# Patient Record
Sex: Female | Born: 1980 | Race: White | Hispanic: No | Marital: Single | Smoking: Never smoker
Health system: Southern US, Community
[De-identification: ages and names within clinical notes are randomized; demographics above are authoritative.]

## PROBLEM LIST (undated history)

## (undated) DIAGNOSIS — I2510 Atherosclerotic heart disease of native coronary artery without angina pectoris: Secondary | ICD-10-CM

## (undated) DIAGNOSIS — I214 Non-ST elevation (NSTEMI) myocardial infarction: Secondary | ICD-10-CM

## (undated) DIAGNOSIS — I10 Essential (primary) hypertension: Secondary | ICD-10-CM

---

## 2001-09-02 ENCOUNTER — Encounter: Payer: Self-pay | Admitting: Emergency Medicine

## 2001-09-02 ENCOUNTER — Emergency Department (HOSPITAL_COMMUNITY): Admission: EM | Admit: 2001-09-02 | Discharge: 2001-09-02 | Payer: Self-pay | Admitting: Emergency Medicine

## 2002-10-21 ENCOUNTER — Other Ambulatory Visit: Admission: RE | Admit: 2002-10-21 | Discharge: 2002-10-21 | Payer: Self-pay | Admitting: Obstetrics and Gynecology

## 2002-10-22 ENCOUNTER — Other Ambulatory Visit: Admission: RE | Admit: 2002-10-22 | Discharge: 2002-10-22 | Payer: Self-pay | Admitting: Obstetrics and Gynecology

## 2003-12-05 ENCOUNTER — Other Ambulatory Visit: Admission: RE | Admit: 2003-12-05 | Discharge: 2003-12-05 | Payer: Self-pay | Admitting: Obstetrics and Gynecology

## 2004-03-11 ENCOUNTER — Emergency Department (HOSPITAL_COMMUNITY): Admission: EM | Admit: 2004-03-11 | Discharge: 2004-03-11 | Payer: Self-pay | Admitting: Emergency Medicine

## 2004-10-17 ENCOUNTER — Emergency Department (HOSPITAL_COMMUNITY): Admission: EM | Admit: 2004-10-17 | Discharge: 2004-10-17 | Payer: Self-pay | Admitting: Emergency Medicine

## 2004-10-22 ENCOUNTER — Other Ambulatory Visit: Admission: RE | Admit: 2004-10-22 | Discharge: 2004-10-22 | Payer: Self-pay | Admitting: Obstetrics and Gynecology

## 2005-04-25 ENCOUNTER — Emergency Department (HOSPITAL_COMMUNITY): Admission: EM | Admit: 2005-04-25 | Discharge: 2005-04-25 | Payer: Self-pay | Admitting: Emergency Medicine

## 2005-05-14 ENCOUNTER — Emergency Department (HOSPITAL_COMMUNITY): Admission: EM | Admit: 2005-05-14 | Discharge: 2005-05-14 | Payer: Self-pay | Admitting: Emergency Medicine

## 2007-06-10 ENCOUNTER — Emergency Department (HOSPITAL_COMMUNITY): Admission: EM | Admit: 2007-06-10 | Discharge: 2007-06-10 | Payer: Self-pay | Admitting: Emergency Medicine

## 2007-09-14 ENCOUNTER — Emergency Department (HOSPITAL_BASED_OUTPATIENT_CLINIC_OR_DEPARTMENT_OTHER): Admission: EM | Admit: 2007-09-14 | Discharge: 2007-09-14 | Payer: Self-pay | Admitting: Emergency Medicine

## 2007-09-19 ENCOUNTER — Emergency Department (HOSPITAL_COMMUNITY): Admission: EM | Admit: 2007-09-19 | Discharge: 2007-09-19 | Payer: Self-pay | Admitting: Emergency Medicine

## 2007-11-07 ENCOUNTER — Other Ambulatory Visit: Payer: Self-pay | Admitting: Emergency Medicine

## 2007-11-08 ENCOUNTER — Inpatient Hospital Stay (HOSPITAL_COMMUNITY): Admission: AD | Admit: 2007-11-08 | Discharge: 2007-11-08 | Payer: Self-pay | Admitting: Obstetrics and Gynecology

## 2008-02-27 ENCOUNTER — Emergency Department (HOSPITAL_BASED_OUTPATIENT_CLINIC_OR_DEPARTMENT_OTHER): Admission: EM | Admit: 2008-02-27 | Discharge: 2008-02-27 | Payer: Self-pay | Admitting: Emergency Medicine

## 2008-04-12 ENCOUNTER — Ambulatory Visit: Payer: Self-pay | Admitting: Internal Medicine

## 2008-04-12 ENCOUNTER — Ambulatory Visit: Payer: Self-pay | Admitting: Interventional Radiology

## 2008-04-12 ENCOUNTER — Encounter: Payer: Self-pay | Admitting: Emergency Medicine

## 2008-04-13 ENCOUNTER — Ambulatory Visit: Payer: Self-pay | Admitting: Cardiovascular Disease

## 2008-04-13 ENCOUNTER — Observation Stay (HOSPITAL_COMMUNITY): Admission: EM | Admit: 2008-04-13 | Discharge: 2008-04-13 | Payer: Self-pay | Admitting: Cardiology

## 2008-04-13 ENCOUNTER — Encounter: Payer: Self-pay | Admitting: Cardiology

## 2008-04-29 DIAGNOSIS — R079 Chest pain, unspecified: Secondary | ICD-10-CM

## 2008-05-02 ENCOUNTER — Ambulatory Visit: Payer: Self-pay | Admitting: Cardiology

## 2008-05-02 ENCOUNTER — Encounter: Payer: Self-pay | Admitting: Cardiology

## 2008-05-09 ENCOUNTER — Ambulatory Visit: Payer: Self-pay | Admitting: Cardiology

## 2008-05-09 ENCOUNTER — Ambulatory Visit: Payer: Self-pay

## 2009-02-07 ENCOUNTER — Emergency Department (HOSPITAL_COMMUNITY): Admission: EM | Admit: 2009-02-07 | Discharge: 2009-02-07 | Payer: Self-pay | Admitting: Emergency Medicine

## 2009-03-02 ENCOUNTER — Encounter (INDEPENDENT_AMBULATORY_CARE_PROVIDER_SITE_OTHER): Payer: Self-pay | Admitting: *Deleted

## 2010-02-20 NOTE — Letter (Signed)
Summary: Appointment - Missed  [facility], Main Office  [address]   [place], [place] [address]   Phone: [phone]  Fax: [phone]     March 02, 2009 MRN: [id number]   [facility] [address] [place], [place]  [address]   Dear Sylvia Wright,  Our records indicate you missed your appointment on 02/28/2009 with Dr. Shirlee Latch. Itis very important that we reach you to reschedule this appointment. We look forward to participating in your health care needs. Please contact us at the number listed above at your earliest convenience to reschedule this appointment.     Sincerely,   [facility] Scheduling Team

## 2010-04-08 LAB — POCT CARDIAC MARKERS
CKMB, poc: <1 ng/mL — ABNORMAL LOW (ref 1.0–8.0)
Myoglobin, poc: 63.1 ng/mL (ref 12–200)

## 2010-04-08 LAB — DIFFERENTIAL
Basophils Absolute: 0 10*3/uL (ref 0.0–0.1)
Basophils Relative: 0 % (ref 0–1)
Lymphocytes Relative: 20 % (ref 12–46)
Neutro Abs: 3.9 10*3/uL (ref 1.7–7.7)
Neutrophils Relative %: 75 % (ref 43–77)

## 2010-04-08 LAB — BASIC METABOLIC PANEL
CO2: 26 mEq/L (ref 19–32)
Calcium: 9.5 mg/dL (ref 8.4–10.5)
Creatinine, Ser: 0.71 mg/dL (ref 0.4–1.2)
GFR calc Af Amer: >60 mL/min (ref >60)
GFR calc non Af Amer: >60 mL/min (ref >60)
Glucose, Bld: 90 mg/dL (ref 70–99)

## 2010-04-08 LAB — CBC
MCHC: 34.4 g/dL (ref 30.0–36.0)
RBC: 4.24 MIL/uL (ref 3.87–5.11)
RDW: 12.8 % (ref 11.5–15.5)

## 2010-05-03 LAB — BASIC METABOLIC PANEL
BUN: 10 mg/dL (ref 6–23)
CO2: 26 mEq/L (ref 19–32)
Calcium: 9.2 mg/dL (ref 8.4–10.5)
Creatinine, Ser: 0.7 mg/dL (ref 0.4–1.2)
Glucose, Bld: 94 mg/dL (ref 70–99)

## 2010-05-03 LAB — POCT CARDIAC MARKERS
Myoglobin, poc: 44.9 ng/mL (ref 12–200)
Myoglobin, poc: 64.9 ng/mL (ref 12–200)

## 2010-05-03 LAB — CBC
MCHC: 34.7 g/dL (ref 30.0–36.0)
MCHC: 35.7 g/dL (ref 30.0–36.0)
MCV: 85.5 fL (ref 78.0–100.0)
Platelets: 277 10*3/uL (ref 150–400)
Platelets: 279 10*3/uL (ref 150–400)
RDW: 12.4 % (ref 11.5–15.5)
WBC: 7.8 10*3/uL (ref 4.0–10.5)

## 2010-05-03 LAB — COMPREHENSIVE METABOLIC PANEL
AST: 17 U/L (ref 0–37)
Albumin: 3.3 g/dL — ABNORMAL LOW (ref 3.5–5.2)
Calcium: 8.5 mg/dL (ref 8.4–10.5)
Chloride: 104 mEq/L (ref 96–112)
Creatinine, Ser: 0.65 mg/dL (ref 0.4–1.2)
GFR calc Af Amer: >60 mL/min (ref >60)

## 2010-05-03 LAB — BRAIN NATRIURETIC PEPTIDE: Pro B Natriuretic peptide (BNP): <30 pg/mL (ref 0.0–100.0)

## 2010-05-03 LAB — HEPARIN LEVEL (UNFRACTIONATED): Heparin Unfractionated: 0.5 IU/mL (ref 0.30–0.70)

## 2010-05-03 LAB — D-DIMER, QUANTITATIVE: D-Dimer, Quant: <0.22 ug/mL-FEU (ref 0.00–0.48)

## 2010-05-03 LAB — CARDIAC PANEL(CRET KIN+CKTOT+MB+TROPI)
CK, MB: 0.5 ng/mL (ref 0.3–4.0)
Relative Index: INVALID (ref 0.0–2.5)
Troponin I: <0.01 ng/mL (ref 0.00–0.06)
Troponin I: <0.01 ng/mL (ref 0.00–0.06)
Troponin I: <0.01 ng/mL (ref 0.00–0.06)

## 2010-05-03 LAB — TSH: TSH: 3.815 u[IU]/mL (ref 0.350–4.500)

## 2010-05-03 LAB — DIFFERENTIAL
Basophils Absolute: 0.1 10*3/uL (ref 0.0–0.1)
Basophils Relative: 2 % — ABNORMAL HIGH (ref 0–1)
Eosinophils Relative: 0 % (ref 0–5)
Lymphocytes Relative: 30 % (ref 12–46)
Lymphs Abs: 2.3 10*3/uL (ref 0.7–4.0)
Monocytes Absolute: 0.4 10*3/uL (ref 0.1–1.0)
Monocytes Absolute: 0.3 10*3/uL (ref 0.1–1.0)
Monocytes Relative: 4 % (ref 3–12)
Neutro Abs: 6.2 10*3/uL (ref 1.7–7.7)
Neutrophils Relative %: 70 % (ref 43–77)

## 2010-05-03 LAB — PROTIME-INR
INR: 1 (ref 0.00–1.49)
Prothrombin Time: 13.9 seconds (ref 11.6–15.2)

## 2010-06-05 NOTE — H&P (Signed)
Sylvia Wright, Sylvia Wright              ACCOUNT NO.:  [id number]   MEDICAL RECORD NO.:  [id number]          PATIENT TYPE:  INP   LOCATION:  2913                         FACILITY:  [facility]   PHYSICIAN:  Therisa Doyne, MD    DATE OF BIRTH:  [birth year]   DATE OF ADMISSION:  04/13/2008  DATE OF DISCHARGE:                              HISTORY & PHYSICAL   CHIEF COMPLAINT:  Chest pain.   HISTORY OF PRESENT ILLNESS:  A 30 year old white female with past  medical history listed below who presents for evaluation of chest pain.  She reports intermittent chest pain since Monday.  She stated that this  was intermittent throughout the day was located in the left side of her  chest.  It was described as a pressure and it radiated into her left  arm.  It was associated with some mild shortness of breath, nausea and  weakness.  Today at work, she developed acute chest pain at 4:30 this  afternoon.  She went to an outside hospital where she was noted to have  elevations in her troponin to 0.23.  Because of these findings, she was  transferred to [facility] for further evaluation.  Currently, she is on  a nitroglycerin drip, as well as a heparin drip and has no chest pain.  Of note, she was recently engaged on Saturday to her fiance.   At an outside emergency room she was given morphine, aspirin,  nitroglycerin intravenously, as well as heparin intravenously.   REVIEW OF SYSTEMS:  All systems reviewed are negative except as  mentioned above in history of present illness.   PAST MEDICAL HISTORY:  1. History of Lyme disease as a child.  2. Gastroesophageal reflux disease.  3. Last menstrual period was April 05, 2008.   SOCIAL HISTORY:  The patient is a Radiation protection practitioner for [employer].  She  denies any tobacco or drugs.  She drinks less than one alcoholic drink  per month.   FAMILY HISTORY:  Negative for early coronary disease.   MEDICATIONS:  1. Yaz.  2. Fish oil.   ALLERGIES:  LEVAQUIN WHICH  CAUSES ITCHING.   PHYSICAL EXAMINATION:  VITAL SIGNS:  Temperature afebrile.  Blood  pressure is 119/64, pulse 110, respirations 18.  Oxygen saturation 99%  on room air.  GENERAL:  No acute distress, obese female.  HEENT:  Normocephalic, atraumatic.  Pupils equal, round and reactive to  light and accommodation.  Extraocular movements are intact.  NECK:  Supple.  No lymphadenopathy, no jugulovenous distention, no  masses.  CARDIOVASCULAR:  Tachycardic, regular rhythm.  No murmurs, rubs  or gallops.  CHEST:  Clear to auscultation bilaterally.  ABDOMEN:  Positive bowel sounds.  Soft, nontender, nondistended.  EXTREMITIES:  No clubbing, cyanosis or edema.  Dorsalis pedis pulses 2+  bilaterally.  SKIN:  No rashes.  BACK:  No CVA tenderness.   PERTINENT LABORATORY DATA:  CBC shows a hemoglobin of 13 and platelet  count of 277.  BMP shows normal renal function with a BUN of 10 and  creatinine of 0.7.  Cardiac enzymes showed troponin less than  0.05,  which then increased to 0.23.  D-dimer less than 0.22.  No acute  cardiopulmonary disease was seen on chest x-ray.  EKG showed sinus  tachycardia at 110 beats per minute with no acute ST-T wave changes.  No  active signs of ischemia.   ASSESSMENT/PLAN:  A 30 year old white female with no prior  cardiovascular risk factors who presents with 2-3 days of chest  discomfort and left arm discomfort and was noted to have a troponin  level of 0.23.   1. We will admit the patient to [facility] Cardiology Service under Dr.      Alford Highland care.   1. Chest pain.  This is concerning for acute coronary syndrome given      her rise in troponins, however, she is very young and has no risk      factors.  There are certainly other etiologies from the      differential and these would include pulmonary embolism,      pericarditis and coronary vasospasm.  Also, this could be Takotsubo      cardiomyopathy as she recently was engaged this weekend.  This      could  have been a precipitant or an inciting stress that could      cause Takotsubo disease.  We will monitor the patient with serial      cardiac enzymes to assess how high her troponin goes.  We will      check a CT scan of the chest tonight to rule out pulmonary embolism      as she is a female on oral contraceptive pills and is at risk for      this.  We will check an echocardiogram in the morning to assess her      left ventricular function to ensure there is no pericardial      effusion.  Check routine laboratory studies for risk stratification      purposes.  As her story is concerning for acute coronary syndrome,      we will treat her aggressively with aspirin 325 mg daily, oral beta-      blocker therapy with Lopressor and IV heparin and nitroglycerin      drips.  Pending results of her next troponin, we will assess      whether or not she will need a 2B3A inhibitor.  We will start her      on Lipitor 80 mg daily.  Ultimately depending the results of her      cardiac enzymes, as well as her CT scan of the chest, she may need      a cardiac catheterization to further define her coronary anatomy to      assess whether or not she has significant coronary disease.   1. Gastroesophageal reflux disease.  This appears to well compensated.      No therapy needed at this time.   1. Fluid/Electrolyte/Nutrition.  Normal saline at 75 mL per hour.      Check serial electrolytes, n.p.o.   1. DVT prophylaxis is not indicated as the patient is on heparin drip.      Therisa Doyne, MD  Electronically Signed     SJT/MEDQ  D:  04/13/2008  T:  04/13/2008  Job:  [phone] [phone]

## 2010-06-05 NOTE — Discharge Summary (Signed)
Sylvia Wright, HARPOLE NO.:  [id number]   MEDICAL RECORD NO.:  [id number]          PATIENT TYPE:  INP   LOCATION:  2913                         FACILITY:  [facility]   PHYSICIAN:  Marca Ancona, MD      DATE OF BIRTH:  1980/07/20   DATE OF ADMISSION:  04/13/2008  DATE OF DISCHARGE:  04/13/2008                               DISCHARGE SUMMARY   PRIMARY CARDIOLOGIST:  Marca Ancona, MD   PRIMARY CARE PHYSICIAN:  Therisa Doyne, MD   PROCEDURES PERFORMED DURING HOSPITALIZATION:  Echocardiogram revealing  overall left ventricular systolic function, normal LVEF 60%, aortic  valve is mildly calcified, left atrial size was in the upper limits of  normal, very poor subcostal windows.   FINAL DISCHARGE DIAGNOSES:  1. Noncardiac chest pain.  2. History of Lyme disease as a child.  3. Gastroesophageal reflux disease.  4. Chronic pain.   HOSPITAL COURSE:  This is a 30 year old Caucasian female with past  medical history of Lyme disease, GERD, and chronic pain syndrome who  reported intermittent chest pain since Monday.  The patient stated it  was intermittent throughout the day, located on left side of her chest,  described as pressure radiating to left arm with associated shortness of  breath.  The patient was seen in an outside hospital and transferred to  [facility] for further evaluation.  She was placed on nitroglycerin drip  and heparin drip and cardiac enzymes were cycled.  The patient was seen  and examined in the hospital by Dr. Gwendalyn Ege and admitted to rule out  myocardial infarction versus pericarditis.   Cardiac enzymes were cycled and found to be negative x3 with the  exception of 1 at 0.23.  The patient's EKG did not show any evidence of  ischemia or pericarditis.  Ventricular rate was mildly elevated at 95.  The patient did undergo echocardiogram during hospitalization as  described above.  The patient was seen and examined by Dr. Marca Ancona  on day of  discharge and found to be stable.  Echocardiogram without any  significant findings.  Her TSH was normal.  His suspicious was that this  chest pain was musculoskeletal and that the point-of-care troponin was  mildly positive with fall.  The patient will be discharged home.  There  will not be a stress test due to risk of false positive Myoview even  though the probability is quite high compared to the true positive  Myoview in her demographics.  She will follow up with Dr. Marca Ancona  in 10-14 days and he will make further recommendations at that time.   DISCHARGE MEDICATIONS:  Same as prior to admission cyclobenzaprine 10 mg  every 8 hours p.r.n., hydrocodone APAP 5/500 every 6 hours p.r.n.,  Ocella daily, fish oil daily, multivitamin daily, Librax four times a  day.   ALLERGIES:  Intolerance to LEVAQUIN.   DISCHARGE LABORATORIES:  Sodium 141, potassium 3.5, chloride 105, CO2 of  26, BUN 10, and creatinine 0.7, glucose 94, hemoglobin 13, white blood  cells 8.8, and platelets 277.  Troponins are negative x3 with  the  exception of one point-of-care marker at 0.23.  EKG revealing sinus  tachycardia without any evidence of ischemia noted.   FOLLOWUP PLANS AND APPOINTMENTS:  The patient will follow up with Dr.  Marca Ancona in an outpatient visit.  Our office will call her to make  an appointment as it is after hours on discharge as Dr. Jearld Pies rounded  late.  The patient has been advised of followup appointment to be made.  Time spent with the patient to include physician time 30 minutes.      Bettey Mare. Lyman Bishop, NP      Marca Ancona, MD  Electronically Signed    KML/MEDQ  D:  04/13/2008  T:  04/14/2008  Job:  [phone]

## 2010-10-17 LAB — URINALYSIS, ROUTINE W REFLEX MICROSCOPIC
Ketones, ur: NEGATIVE
Nitrite: NEGATIVE
Specific Gravity, Urine: 1.009
pH: 6

## 2010-10-23 LAB — DIFFERENTIAL
Basophils Absolute: 0.1
Basophils Relative: 1
Eosinophils Absolute: 0.1
Neutro Abs: 6.7
Neutrophils Relative %: 76

## 2010-10-23 LAB — BASIC METABOLIC PANEL
BUN: 8
CO2: 25
Calcium: 9.5
Creatinine, Ser: 0.7
GFR calc Af Amer: >60
Glucose, Bld: 96

## 2010-10-23 LAB — WET PREP, GENITAL
Clue Cells Wet Prep HPF POC: NONE SEEN
Trich, Wet Prep: NONE SEEN
Yeast Wet Prep HPF POC: NONE SEEN

## 2010-10-23 LAB — CBC
MCHC: 34.7
Platelets: 329
RBC: 4.67
RDW: 12.6

## 2010-10-23 LAB — URINALYSIS, ROUTINE W REFLEX MICROSCOPIC
Hgb urine dipstick: NEGATIVE
Ketones, ur: NEGATIVE
Protein, ur: NEGATIVE
Urobilinogen, UA: 0.2

## 2011-08-20 ENCOUNTER — Encounter (HOSPITAL_COMMUNITY): Payer: Self-pay | Admitting: Emergency Medicine

## 2011-08-20 ENCOUNTER — Emergency Department (HOSPITAL_COMMUNITY)
Admission: EM | Admit: 2011-08-20 | Discharge: 2011-08-21 | Disposition: A | Discharge status: Home / Self Care | Payer: BC Managed Care – PPO | Attending: Emergency Medicine | Admitting: Emergency Medicine

## 2011-08-20 DIAGNOSIS — M545 Low back pain, unspecified: Secondary | ICD-10-CM | POA: Insufficient documentation

## 2011-08-20 DIAGNOSIS — I252 Old myocardial infarction: Secondary | ICD-10-CM | POA: Insufficient documentation

## 2011-08-20 NOTE — ED Notes (Signed)
Patient states that she fell staturday. The patient reports that she feel that her "tailbone" is hurting

## 2011-08-20 NOTE — ED Provider Notes (Signed)
History     CSN: [id number]  Arrival date & time 08/20/11  2217   First MD Initiated Contact with Patient 08/20/11 2325      Chief Complaint  Patient presents with  . Back Pain    (Consider location/radiation/quality/duration/timing/severity/associated sxs/prior treatment) HPI  31 year old female presents complaining of low back pain after fall. Patient states she was walking out on the wet ground outside, slipped fell and landed on her buttock. This happened 4 days ago.  Is having continuous pain to her low back. Pain is constant, sharp and throbbing, worsening when sitting on it. Denies pain with bowel movement, or blood in the stool. Denies any other injury.    Past Medical History  Diagnosis Date  . Heart attack     History reviewed. No pertinent past surgical history.  History reviewed. No pertinent family history.  History  Substance Use Topics  . Smoking status: Never Smoker   . Smokeless tobacco: Not on file  . Alcohol Use: No    OB History    Grav Para Term Preterm Abortions TAB SAB Ect Mult Living                  Review of Systems  Constitutional: Negative for fever.  Gastrointestinal: Negative for blood in stool and rectal pain.  Genitourinary: Negative for hematuria, flank pain, vaginal bleeding, difficulty urinating and pelvic pain.  Musculoskeletal: Positive for back pain. Negative for gait problem.  Skin: Negative for rash and wound.  Neurological: Negative for numbness.  All other systems reviewed and are negative.    Allergies  Levofloxacin  Home Medications   Current Outpatient Rx  Name Route Sig Dispense Refill  . ADULT MULTIVITAMIN W/MINERALS CH Oral Take 1 tablet by mouth daily.      BP 127/69  Pulse 89  Temp 97.8 F (36.6 C) (Oral)  Resp 16  SpO2 10%  LMP 08/06/2011  Physical Exam  Nursing note and vitals reviewed. Constitutional: She is oriented to person, place, and time. She appears well-developed and well-nourished. No  distress.  HENT:  Head: Atraumatic.  Eyes: Conjunctivae are normal.  Neck: Neck supple.  Pulmonary/Chest: Effort normal. She exhibits no tenderness.  Abdominal: Soft. There is no tenderness.  Musculoskeletal: She exhibits tenderness.       Right hip: Normal.       Left hip: Normal.       Cervical back: Normal.       Thoracic back: Normal.       Lumbar back: She exhibits tenderness, bony tenderness and pain. She exhibits normal range of motion, no swelling, no edema, no deformity and no laceration.       Back:  Neurological: She is alert and oriented to person, place, and time.  Skin: Skin is warm. No rash noted.  Psychiatric: She has a normal mood and affect.    ED Course  Procedures (including critical care time)  Labs Reviewed - No data to display No results found.   No diagnosis found. Dg Lumbar Spine Complete  08/21/2011  *RADIOLOGY REPORT*  Clinical Data: Fall, lower back pain  LUMBAR SPINE - COMPLETE 4+ VIEW  Comparison: 11/08/2007 CT  Findings: Mild lower lumbar degenerative changes. The imaged lumbar vertebral bodies and inter-vertebral disc spaces are maintained. No displaced acute fracture or dislocation identified. The lower sacrum/coccyx are not imaged on the lateral views.  The para- vertebral and overlying soft tissues are within normal limits.  IMPRESSION: No acute osseous abnormality of the lumbar  spine.  Mild lower thoracic degenerative changes.  Original Report Authenticated By: Waneta Martins, M.D.    1. Low back pain  MDM  Fall on buttock 4 days ago, having pain since.  Request xray.  No rectal involvement.     12:26 AM Xray neg.  Recommend RICE therapy.  Donut pillow for comfort.  Ortho referral.         Fayrene Helper, PA-C 08/21/11 1610

## 2011-08-21 ENCOUNTER — Emergency Department (HOSPITAL_COMMUNITY): Payer: BC Managed Care – PPO

## 2011-08-21 MED ORDER — IBUPROFEN 600 MG PO TABS: 600.0000 mg | ORAL_TABLET | Freq: Four times a day (QID) | ORAL | Status: AC | PRN

## 2011-08-21 MED ORDER — TRAMADOL HCL 50 MG PO TABS: 50.0000 mg | ORAL_TABLET | Freq: Four times a day (QID) | ORAL | Status: AC | PRN

## 2011-08-21 NOTE — Discharge Instructions (Signed)
Tailbone Injury The tailbone (coccyx) is the small bone at the lower end of the spine. A tailbone injury may involve stretched ligaments, bruising, or a broken bone (fracture). Women are more vulnerable to this injury due to having a wider pelvis. CAUSES  This type of injury typically occurs from falling and landing on the tailbone. Repeated strain or friction from actions such as rowing and bicycling may also injure the area. The tailbone can be injured during childbirth. Infections or tumors may also press on the tailbone and cause pain. Sometimes, the cause of injury is unknown. SYMPTOMS   Bruising.   Pain when sitting.   Painful bowel movements.   In women, pain during intercourse.  DIAGNOSIS  Your caregiver can diagnose a tailbone injury based on your symptoms and a physical exam. X-rays may be taken if a fracture is suspected. Your caregiver may also use an MRI scan imaging test to evaluate your symptoms. TREATMENT  Your caregiver may prescribe medicines to help relieve your pain. Most tailbone injuries heal on their own in 4 to 6 weeks. However, if the injury is caused by an infection or tumor, the recovery period may vary. PREVENTION  Wear appropriate padding and sports gear when bicycling and rowing. This can help prevent an injury from repeated strain or friction. HOME CARE INSTRUCTIONS   Put ice on the injured area.   Put ice in a plastic bag.   Place a towel between your skin and the bag.   Leave the ice on for 15 to 20 minutes, every hour while awake for the first 1 to 2 days.   Sit on a large, rubber or inflated ring or cushion to ease your pain. Lean forward when sitting to help decrease discomfort.   Avoid sitting for long periods of time.   Increase your activity as the pain allows.   Only take over-the-counter or prescription medicines for pain, discomfort, or fever as directed by your caregiver.   You may use stool softeners if it is painful to have a bowel  movement, or as directed by your caregiver.   Eat a diet with plenty of fiber to help prevent constipation.   Keep all follow-up appointments as directed by your caregiver.  SEEK MEDICAL CARE IF:   Your pain becomes worse.   Your bowel movements cause a great deal of discomfort.   You are unable to have a bowel movement.   You have a fever.  MAKE SURE YOU:  Understand these instructions.   Will watch your condition.   Will get help right away if you are not doing well or get worse.  Document Released: 01/05/2000 Document Revised: 12/27/2010 Document Reviewed: 08/02/2010 [facility] Patient Information 2012 [place], [place].  RICE: Routine Care for Injuries The routine care of many injuries includes Rest, Ice, Compression, and Elevation (RICE). HOME CARE INSTRUCTIONS  Rest is needed to allow your body to heal. Routine activities can usually be resumed when comfortable. Injured tendons and bones can take up to 6 weeks to heal. Tendons are the cord-like structures that attach muscle to bone.   Ice following an injury helps keep the swelling down and reduces pain.   Put ice in a plastic bag.   Place a towel between your skin and the bag.   Leave the ice on for 15 to 20 minutes, 3 to 4 times a day. Do this while awake, for the first 24 to 48 hours. After that, continue as directed by your caregiver.  Compression helps keep swelling down. It also gives support and helps with discomfort. If an elastic bandage has been applied, it should be removed and reapplied every 3 to 4 hours. It should not be applied tightly, but firmly enough to keep swelling down. Watch fingers or toes for swelling, bluish discoloration, coldness, numbness, or excessive pain. If any of these problems occur, remove the bandage and reapply loosely. Contact your caregiver if these problems continue.   Elevation helps reduce swelling and decreases pain. With extremities, such as the arms, hands, legs, and feet, the  injured area should be placed near or above the level of the heart, if possible.  SEEK IMMEDIATE MEDICAL CARE IF:  You have persistent pain and swelling.   You develop redness, numbness, or unexpected weakness.   Your symptoms are getting worse rather than improving after several days.  These symptoms may indicate that further evaluation or further X-rays are needed. Sometimes, X-rays may not show a small broken bone (fracture) until 1 week or 10 days later. Make a follow-up appointment with your caregiver. Ask when your X-ray results will be ready. Make sure you get your X-ray results. Document Released: 04/21/2000 Document Revised: 12/27/2010 Document Reviewed: 06/08/2010 [facility] Patient Information 2012 [place], [place].

## 2011-08-21 NOTE — ED Provider Notes (Signed)
Medical screening examination/treatment/procedure(s) were performed by non-physician practitioner and as supervising physician I was immediately available for consultation/collaboration.   Sylvia Wright. Oletta Lamas, MD 08/21/11 [phone]

## 2013-04-01 ENCOUNTER — Encounter (HOSPITAL_COMMUNITY): Payer: Self-pay | Admitting: Emergency Medicine

## 2013-04-01 ENCOUNTER — Emergency Department (HOSPITAL_COMMUNITY)
Admission: EM | Admit: 2013-04-01 | Discharge: 2013-04-01 | Disposition: A | Discharge status: Home / Self Care | Payer: BC Managed Care – PPO | Attending: Emergency Medicine | Admitting: Emergency Medicine

## 2013-04-01 ENCOUNTER — Ambulatory Visit (INDEPENDENT_AMBULATORY_CARE_PROVIDER_SITE_OTHER): Payer: BC Managed Care – PPO | Admitting: Emergency Medicine

## 2013-04-01 ENCOUNTER — Emergency Department (HOSPITAL_COMMUNITY): Payer: BC Managed Care – PPO

## 2013-04-01 VITALS — BP 130/90 | HR 104 | Temp 98.6°F | Resp 16 | Ht 66.5 in | Wt 246.0 lb

## 2013-04-01 DIAGNOSIS — R110 Nausea: Secondary | ICD-10-CM | POA: Insufficient documentation

## 2013-04-01 DIAGNOSIS — R079 Chest pain, unspecified: Secondary | ICD-10-CM

## 2013-04-01 DIAGNOSIS — I252 Old myocardial infarction: Secondary | ICD-10-CM | POA: Insufficient documentation

## 2013-04-01 DIAGNOSIS — R072 Precordial pain: Secondary | ICD-10-CM

## 2013-04-01 DIAGNOSIS — R42 Dizziness and giddiness: Secondary | ICD-10-CM | POA: Insufficient documentation

## 2013-04-01 HISTORY — DX: Non-ST elevation (NSTEMI) myocardial infarction: I21.4

## 2013-04-01 LAB — I-STAT TROPONIN, ED
TROPONIN I, POC: 0 ng/mL (ref 0.00–0.08)
Troponin i, poc: 0 ng/mL (ref 0.00–0.08)

## 2013-04-01 LAB — CBC
HCT: 38.6 % (ref 36.0–46.0)
Hemoglobin: 13.4 g/dL (ref 12.0–15.0)
MCH: 29.6 pg (ref 26.0–34.0)
MCHC: 34.7 g/dL (ref 30.0–36.0)
MCV: 85.4 fL (ref 78.0–100.0)
PLATELETS: 298 10*3/uL (ref 150–400)
RBC: 4.52 MIL/uL (ref 3.87–5.11)
RDW: 12.7 % (ref 11.5–15.5)
WBC: 8.7 10*3/uL (ref 4.0–10.5)

## 2013-04-01 LAB — BASIC METABOLIC PANEL
BUN: 9 mg/dL (ref 6–23)
CO2: 22 mEq/L (ref 19–32)
Calcium: 9.1 mg/dL (ref 8.4–10.5)
Chloride: 103 mEq/L (ref 96–112)
Creatinine, Ser: 0.65 mg/dL (ref 0.50–1.10)
Glucose, Bld: 99 mg/dL (ref 70–99)
POTASSIUM: 4 meq/L (ref 3.7–5.3)
SODIUM: 139 meq/L (ref 137–147)

## 2013-04-01 LAB — HEPATIC FUNCTION PANEL
ALK PHOS: 70 U/L (ref 39–117)
ALT: 9 U/L (ref 0–35)
AST: 14 U/L (ref 0–37)
Albumin: 3.7 g/dL (ref 3.5–5.2)
Total Bilirubin: 0.6 mg/dL (ref 0.3–1.2)
Total Protein: 6.7 g/dL (ref 6.0–8.3)

## 2013-04-01 LAB — D-DIMER, QUANTITATIVE: D-Dimer, Quant: <0.27 ug/mL-FEU (ref 0.00–0.48)

## 2013-04-01 LAB — LIPASE, BLOOD: Lipase: 21 U/L (ref 11–59)

## 2013-04-01 MED ORDER — NITROGLYCERIN 0.3 MG SL SUBL
0.4000 mg | SUBLINGUAL_TABLET | Freq: Once | SUBLINGUAL | Status: AC
  Administered 2013-04-01: 0.3 mg via SUBLINGUAL

## 2013-04-01 MED ORDER — PROMETHAZINE HCL 25 MG PO TABS
25.0000 mg | ORAL_TABLET | Freq: Once | ORAL | Status: AC
  Administered 2013-04-01: 25 mg via ORAL
  Filled 2013-04-01: qty 1

## 2013-04-01 MED ORDER — METOCLOPRAMIDE HCL 5 MG/ML IJ SOLN
10.0000 mg | Freq: Once | INTRAMUSCULAR | Status: AC
  Administered 2013-04-01: 10 mg via INTRAVENOUS
  Filled 2013-04-01: qty 2

## 2013-04-01 MED ORDER — PROMETHAZINE HCL 25 MG PO TABS: 25.0000 mg | ORAL_TABLET | Freq: Four times a day (QID) | ORAL | Status: AC | PRN

## 2013-04-01 MED ORDER — ASPIRIN 81 MG PO CHEW
324.0000 mg | CHEWABLE_TABLET | Freq: Once | ORAL | Status: AC
  Administered 2013-04-01: 324 mg via ORAL

## 2013-04-01 MED ORDER — MORPHINE SULFATE 4 MG/ML IJ SOLN
4.0000 mg | Freq: Once | INTRAMUSCULAR | Status: AC
  Administered 2013-04-01: 4 mg via INTRAVENOUS
  Filled 2013-04-01: qty 1

## 2013-04-01 NOTE — ED Provider Notes (Signed)
CSN: [id number]     Arrival date & time 04/01/13  1133 History   First MD Initiated Contact with Patient 04/01/13 1136     Chief Complaint  Patient presents with  . Chest Pain     (Consider location/radiation/quality/duration/timing/severity/associated sxs/prior Treatment) HPI 33 year old female who was sent here from [facility] urgent care [facility] for further evaluation of chest pain. Patient complaining of pain to the midsternal region it is intermittent and radiates to her left arm that started yesterday morning while she was at home not doing any heavy activities. Describe chest pain as a sharp sensation radiates to the back along the both shoulder blades it also radiates to her left arm. Nothing seems to make pain better or worse. She also complaining of lightheadedness and nauseous during that episode lasting for more than 6 hours. Pain as its worst was 7/10 and currently pain is 3/10. Reports she has no appetite. 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 Did not take any medication at that time. This morning she continues to endorse midsternal chest pain which followup at urgent care and was sent here for further evaluation. She did receive aspirin and sublingual nitroglycerin prior to arrival. Patient reports several years ago she had similar episode in which she was found to have elevated troponin and was admitted to the hospital. States she had a chest CTA to rule out PE that was negative and subsequently had a cardiac stress test that was unremarkable. She endorses strong family history of heart disease however denies any history of hypertension, diabetes, and is a nonsmoker. She denies any prior history of PE or DVT. Denies any recent surgery, prolonged bed rest, recent travel, taking birth control, having unilateral leg swelling or calf pain.     Past Medical History  Diagnosis Date  . NSTEMI (non-ST elevated myocardial infarction)    No past surgical  history on file. No family history on file. History  Substance Use Topics  . Smoking status: Never Smoker   . Smokeless tobacco: Not on file  . Alcohol Use: No   OB History   Grav Para Term Preterm Abortions TAB SAB Ect Mult Living                 Review of Systems  All other systems reviewed and are negative.      Allergies  Levofloxacin  Home Medications   Current Outpatient Rx  Name  Route  Sig  Dispense  Refill  . Multiple Vitamin (MULTIVITAMIN WITH MINERALS) TABS   Oral   Take 1 tablet by mouth daily.          BP 130/73  Pulse 109  Temp(Src) 98.3 F (36.8 C) (Oral)  Resp 18  SpO2 98%  LMP 03/20/2013 Physical Exam  Nursing note and vitals reviewed. Constitutional: She is oriented to person, place, and time. She appears well-developed and well-nourished. No distress.  Awake, alert, nontoxic appearance  HENT:  Head: Atraumatic.  Eyes: Conjunctivae are normal. Right eye exhibits no discharge. Left eye exhibits no discharge.  Neck: Neck supple.  No carotid bruit  Cardiovascular: Normal rate and regular rhythm.  Exam reveals no gallop and no friction rub.   No murmur heard. Pulmonary/Chest: Effort normal. No respiratory distress. She has no wheezes. She has no rales. She exhibits no tenderness.  Abdominal: Soft. There is no tenderness. There is no rebound.  Abdomen is soft, nontender. No Murphy sign, no McBurney's point, no  hernia noted.  Musculoskeletal: She exhibits no edema and no tenderness.  ROM appears intact, no obvious focal weakness  Bilateral lower extremities without palpable cords, erythema, edema, negative Homans sign.  Neurological: She is alert and oriented to person, place, and time.  Mental status and motor strength appears intact  Skin: No rash noted.  Psychiatric: She has a normal mood and affect.    ED Course  Procedures (including critical care time)  12:16 PM Patient with history of NSTEMI presents complaining of chest pain.  Her chest pain has improved but certainly not symptom free. Morphine given, work up initiated, will continue to monitor.  2:34 PM HEART score is 1, TIMI 0, cannot PERC but pt has negative D-Dimer which makes PE unlikely.  Pt has no abdominal pain, low suspicion for biliary disease or pancreatitis.  Low suspicion for aortic dissection.  Pt is currently stable, resting comfortably in bed, c/o mild nausea and mild chest discomfort.  reglan and morphine given.    Labs Review Labs Reviewed  CBC  BASIC METABOLIC PANEL  D-DIMER, QUANTITATIVE  HEPATIC FUNCTION PANEL  LIPASE, BLOOD  I-STAT TROPOININ, ED  Rosezena Sensor, ED   Imaging Review Dg Chest 2 View  04/01/2013   CLINICAL DATA:  Left-sided chest pain and weakness  EXAM: CHEST  2 VIEW  COMPARISON:  02/07/2009  FINDINGS: The heart size and mediastinal contours are within normal limits. Mildly low lung volumes. Both lungs are clear. The visualized skeletal structures are unremarkable. No pneumothorax.  IMPRESSION: No active cardiopulmonary disease.   Electronically Signed   By: Tiburcio Pea M.D.   On: 04/01/2013 13:19     EKG Interpretation None      Date: 04/01/2013  Rate: 99  Rhythm: normal sinus rhythm  QRS Axis: normal  Intervals: normal  ST/T Wave abnormalities: normal  Conduction Disutrbances: none  Narrative Interpretation: minimal ST depression, inferior leads  Old EKG Reviewed: No significant changes noted     MDM   Final diagnoses:  Chest pain  Nausea    BP 107/61  Pulse 93  Temp(Src) 97.8 F (36.6 C) (Oral)  Resp 16  SpO2 97%  LMP 03/20/2013  I have reviewed nursing notes and vital signs. I personally reviewed the imaging tests through PACS system  I reviewed available ER/hospitalization records thought the EMR    Fayrene Helper, PA-C 04/02/13 1506

## 2013-04-01 NOTE — ED Notes (Signed)
[facility] with complaints of central chest pain with intermittent left arm pain started last night. Pt has HAD 324 ASA, 3 nitro, 8mg  of IV zofran. CP initially 7/10 sts pain is now 3/10- pt still c/o nausea.

## 2013-04-01 NOTE — Discharge Instructions (Signed)
Please follow up with your heart doctor for further evaluation of your chest discomfort.  Take Phenergan as needed for nausea.  Return to ER if you have any other concerns.   Chest Pain Observation It is often hard to give a specific diagnosis for the cause of chest pain. Among other possibilities your symptoms might be caused by inadequate oxygen delivery to your heart (angina). Angina that is not treated or evaluated can lead to a heart attack (myocardial infarction) or death. Blood tests, electrocardiograms, and X-rays may have been done to help determine a possible cause of your chest pain. After evaluation and observation, your health care provider has determined that it is unlikely your pain was caused by an unstable condition that requires hospitalization. However, a full evaluation of your pain may need to be completed, with additional diagnostic testing as directed. It is very important to keep your follow-up appointments. Not keeping your follow-up appointments could result in permanent heart damage, disability, or death. If there is any problem keeping your follow-up appointments, you must call your health care provider. HOME CARE INSTRUCTIONS  Due to the slight chance that your pain could be angina, it is important to follow your health care provider's treatment plan and also maintain a healthy lifestyle:  Maintain or work toward achieving a healthy weight.  Stay physically active and exercise regularly.  Decrease your salt intake.  Eat a balanced, healthy diet. Talk to a dietician to learn about heart healthy foods.  Increase your fiber intake by including whole grains, vegetables, fruits, and nuts in your diet.  Avoid situations that cause stress, anger, or depression.  Take medicines as advised by your health care provider. Report any side effects to your health care provider. Do not stop medicines or adjust the dosages on your own.  Quit smoking. Do not use nicotine patches or  gum until you check with your health care provider.  Keep your blood pressure, blood sugar, and cholesterol levels within normal limits.  Limit alcohol intake to no more than 1 drink per day for women that are not pregnant and 2 drinks per day for men.  Do not abuse drugs. SEEK IMMEDIATE MEDICAL CARE IF: You have severe chest pain or pressure which may include symptoms such as:  You feel pain or pressure in you arms, neck, jaw, or back.  You have severe back or abdominal pain, feel sick to your stomach (nauseous), or throw up (vomit).  You are sweating profusely.  You are having a fast or irregular heartbeat.  You feel short of breath while at rest.  You notice increasing shortness of breath during rest, sleep, or with activity.  You have chest pain that does not get better after rest or after taking your usual medicine.  You wake from sleep with chest pain.  You are unable to sleep because you cannot breathe.  You develop a frequent cough or you are coughing up blood.  You feel dizzy, faint, or experience extreme fatigue.  You develop severe weakness, dizziness, fainting, or chills. Any of these symptoms may represent a serious problem that is an emergency. Do not wait to see if the symptoms will go away. Call your local emergency services (911 in the [place] Do not drive yourself to the hospital. MAKE SURE YOU:  Understand these instructions.  Will watch your condition.  Will get help right away if you are not doing well or get worse. Document Released: 02/09/2010 Document Revised: 09/09/2012 Document Reviewed: 07/09/2012 [employer] Patient  Information ©2014 [employer]. ° °

## 2013-04-01 NOTE — ED Notes (Signed)
[facility]- gait steady, no SOB. Pt sPo2 increased form 95% to 100% with walking.

## 2013-04-01 NOTE — Addendum Note (Signed)
Addended by: Nita SellsSMITH, Kail Fraley S on: 04/01/2013 10:59 AM   Modules accepted: Orders

## 2013-04-01 NOTE — Progress Notes (Signed)
[facility] [address], [place] [address] [phone]  Date:  04/01/2013   Name:  Sylvia Wright   DOB:  1981-01-01   MRN:  [id number]  PCP:  Malka SoJOBE,DANIEL B., MD    Chief Complaint: Chest Pain, Arm Pain and Nausea   History of Present Illness:  Sylvia Wright is a 33 y.o. very pleasant female patient who presents with the following:  Says history of "maybe" a heart attack 3 years or so ago.  Had elevated troponin and spent several days in the hospital.  Ruled out a PE.  Negative stress test. Told by cardiology they could not exclude a heart attack.  Has elevated cholesterol but no diabetes or HBP.  No medications.  Yesterday experienced pain in the upper chest associated with dizziness for 3-4 hours and pain into the left arm.  Pain continued through the night and is still present today but less.  She is still nauseated.  Was nauseated and did not eat.  Works for [employer] and worked despite the pain.  No ASA.  Non smoker.  No OCP.  No improvement with over the counter medications or other home remedies. Denies other complaint or health concern today.   Patient Active Problem List   Diagnosis Date Noted  . CHEST PAIN 04/29/2008    Past Medical History  Diagnosis Date  . Heart attack     History reviewed. No pertinent past surgical history.  History  Substance Use Topics  . Smoking status: Never Smoker   . Smokeless tobacco: Not on file  . Alcohol Use: No    History reviewed. No pertinent family history.  Allergies  Allergen Reactions  . Levofloxacin     REACTION: intolerance    Medication list has been reviewed and updated.  Current Outpatient Prescriptions on File Prior to Visit  Medication Sig Dispense Refill  . Multiple Vitamin (MULTIVITAMIN WITH MINERALS) TABS Take 1 tablet by mouth daily.       No current facility-administered medications on file prior to visit.    Review of Systems:  As per HPI, otherwise negative.    Physical  Examination: Filed Vitals:   04/01/13 1016  BP: 130/90  Pulse: 104  Temp: 98.6 F (37 C)  Resp: 16   Filed Vitals:   04/01/13 1016  Height: 5' 6.5" (1.689 m)  Weight: 246 lb (111.585 kg)   Body mass index is 39.12 kg/(m^2). Ideal Body Weight: Weight in (lb) to have BMI = 25: 156.9  GEN: morbid obesity, NAD, Non-toxic, A & O x 3 HEENT: Atraumatic, Normocephalic. Neck supple. No masses, No LAD. Ears and Nose: No external deformity. CV: RRR, No M/G/R. No JVD. No thrill. No extra heart sounds. PULM: CTA B, no wheezes, crackles, rhonchi. No retractions. No resp. distress. No accessory muscle use. ABD: S, NT, ND, +BS. No rebound. No HSM. EXTR: No c/c/e NEURO Normal gait.  PSYCH: Normally interactive. Conversant. Not depressed or anxious appearing.  Calm demeanor.    Assessment and Plan: Chest pain Morbid obesity To ER for further evaluation. IV O2 NTG ASA  Signed,  Phillips OdorJeffery Anderson, MD

## 2013-04-06 NOTE — ED Provider Notes (Signed)
Medical screening examination/treatment/procedure(s) were performed by non-physician practitioner and as supervising physician I was immediately available for consultation/collaboration.   EKG Interpretation   Date/Time:  Thursday April 01 2013 11:43:07 EDT Ventricular Rate:  99 PR Interval:  137 QRS Duration: 83 QT Interval:  341 QTC Calculation: 438 R Axis:   49 Text Interpretation:  Sinus rhythm Minimal ST depression, inferior leads  ED PHYSICIAN INTERPRETATION AVAILABLE IN [facility] Confirmed by  TEST, Record ([id number]) on 04/03/2013 9:13:25 AM        Shelda JakesScott W. Mccartney Chuba, MD 04/06/13 1410

## 2013-08-18 ENCOUNTER — Other Ambulatory Visit: Payer: Self-pay | Admitting: Endocrinology

## 2013-08-18 DIAGNOSIS — E041 Nontoxic single thyroid nodule: Secondary | ICD-10-CM

## 2013-08-26 ENCOUNTER — Other Ambulatory Visit (HOSPITAL_COMMUNITY)
Admission: RE | Admit: 2013-08-26 | Discharge: 2013-08-26 | Disposition: A | Discharge status: Home / Self Care | Payer: BC Managed Care – PPO | Source: Ambulatory Visit | Attending: Endocrinology | Admitting: Endocrinology

## 2013-08-26 ENCOUNTER — Ambulatory Visit
Admission: RE | Admit: 2013-08-26 | Discharge: 2013-08-26 | Disposition: A | Discharge status: Home / Self Care | Payer: BC Managed Care – PPO | Source: Ambulatory Visit | Attending: Endocrinology | Admitting: Endocrinology

## 2013-08-26 DIAGNOSIS — E041 Nontoxic single thyroid nodule: Secondary | ICD-10-CM

## 2014-01-28 ENCOUNTER — Encounter: Payer: Self-pay | Admitting: Cardiology

## 2014-10-06 ENCOUNTER — Encounter (HOSPITAL_BASED_OUTPATIENT_CLINIC_OR_DEPARTMENT_OTHER): Payer: Self-pay | Admitting: Emergency Medicine

## 2014-10-06 ENCOUNTER — Emergency Department (HOSPITAL_BASED_OUTPATIENT_CLINIC_OR_DEPARTMENT_OTHER): Payer: BLUE CROSS/BLUE SHIELD

## 2014-10-06 ENCOUNTER — Emergency Department (HOSPITAL_BASED_OUTPATIENT_CLINIC_OR_DEPARTMENT_OTHER)
Admission: EM | Admit: 2014-10-06 | Discharge: 2014-10-06 | Disposition: A | Discharge status: Home / Self Care | Payer: BLUE CROSS/BLUE SHIELD | Attending: Emergency Medicine | Admitting: Emergency Medicine

## 2014-10-06 DIAGNOSIS — R110 Nausea: Secondary | ICD-10-CM | POA: Insufficient documentation

## 2014-10-06 DIAGNOSIS — Z3202 Encounter for pregnancy test, result negative: Secondary | ICD-10-CM | POA: Insufficient documentation

## 2014-10-06 DIAGNOSIS — F419 Anxiety disorder, unspecified: Secondary | ICD-10-CM | POA: Insufficient documentation

## 2014-10-06 DIAGNOSIS — R079 Chest pain, unspecified: Secondary | ICD-10-CM | POA: Insufficient documentation

## 2014-10-06 DIAGNOSIS — R5383 Other fatigue: Secondary | ICD-10-CM | POA: Diagnosis not present

## 2014-10-06 LAB — BASIC METABOLIC PANEL
Anion gap: 8 (ref 5–15)
BUN: 14 mg/dL (ref 6–20)
CHLORIDE: 104 mmol/L (ref 101–111)
CO2: 25 mmol/L (ref 22–32)
Calcium: 9.1 mg/dL (ref 8.9–10.3)
Creatinine, Ser: 0.91 mg/dL (ref 0.44–1.00)
GFR calc Af Amer: >60 mL/min (ref >60)
GFR calc non Af Amer: >60 mL/min (ref >60)
GLUCOSE: 97 mg/dL (ref 65–99)
POTASSIUM: 3.5 mmol/L (ref 3.5–5.1)
Sodium: 137 mmol/L (ref 135–145)

## 2014-10-06 LAB — URINALYSIS, ROUTINE W REFLEX MICROSCOPIC
Bilirubin Urine: NEGATIVE
Glucose, UA: NEGATIVE mg/dL
Hgb urine dipstick: NEGATIVE
Ketones, ur: 40 mg/dL — AB
NITRITE: NEGATIVE
Protein, ur: NEGATIVE mg/dL
SPECIFIC GRAVITY, URINE: 1.028 (ref 1.005–1.030)
UROBILINOGEN UA: 1 mg/dL (ref 0.0–1.0)
pH: 6 (ref 5.0–8.0)

## 2014-10-06 LAB — CBC
HEMATOCRIT: 40.9 % (ref 36.0–46.0)
Hemoglobin: 13.9 g/dL (ref 12.0–15.0)
MCH: 29.4 pg (ref 26.0–34.0)
MCHC: 34 g/dL (ref 30.0–36.0)
MCV: 86.5 fL (ref 78.0–100.0)
Platelets: 345 10*3/uL (ref 150–400)
RBC: 4.73 MIL/uL (ref 3.87–5.11)
RDW: 13 % (ref 11.5–15.5)
WBC: 9.3 10*3/uL (ref 4.0–10.5)

## 2014-10-06 LAB — DIFFERENTIAL
BASOS ABS: 0.1 10*3/uL (ref 0.0–0.1)
Basophils Relative: 1 %
Eosinophils Absolute: 0.1 10*3/uL (ref 0.0–0.7)
Eosinophils Relative: 1 %
LYMPHS ABS: 1.8 10*3/uL (ref 0.7–4.0)
Lymphocytes Relative: 20 %
MONOS PCT: 6 %
Monocytes Absolute: 0.5 10*3/uL (ref 0.1–1.0)
NEUTROS ABS: 6.8 10*3/uL (ref 1.7–7.7)
Neutrophils Relative %: 74 %

## 2014-10-06 LAB — URINE MICROSCOPIC-ADD ON

## 2014-10-06 LAB — PREGNANCY, URINE: PREG TEST UR: NEGATIVE

## 2014-10-06 LAB — TROPONIN I
Troponin I: <0.03 ng/mL (ref <0.031)
Troponin I: <0.03 ng/mL (ref <0.031)

## 2014-10-06 MED ORDER — ONDANSETRON HCL 4 MG/2ML IJ SOLN
4.0000 mg | Freq: Once | INTRAMUSCULAR | Status: AC
  Administered 2014-10-06: 4 mg via INTRAVENOUS
  Filled 2014-10-06: qty 2

## 2014-10-06 NOTE — ED Provider Notes (Signed)
CSN: [id number]     Arrival date & time 10/06/14  1745 History   First MD Initiated Contact with Patient 10/06/14 1810     Chief Complaint  Patient presents with  . Chest Pain     (Consider location/radiation/quality/duration/timing/severity/associated sxs/prior Treatment) HPI Comments: Patient presents with complaint of one day of nausea without vomiting as well as a dull upper left sided chest pain which started earlier this afternoon. Patient describes generalized fatigue as well. No fevers, shortness of breath, cough, palpitations. No diarrhea or abdominal pain. No urinary symptoms. No treatments prior to arrival. Patient denies history of hypertension, diabetes, smoking. Patient's father has had multiple stents. No CAD a young age. She has several brothers and sisters who are younger and do not have heart problems. Patient was previously on cholesterol medications however this was discontinued.  In 2010 the patient was seen for chest pain. She had an elevated second troponin in the emergency department and was subsequently transferred to [facility] were the remainder of her troponins were negative. She had a normal echocardiogram. Patient was ultimately diagnosed with noncardiac chest pain.  Patient denies risk factors for pulmonary embolism including: unilateral leg swelling, history of DVT/PE/other blood clots, use of estrogens, recent immobilizations, recent surgery, recent travel (>4hr segment), malignancy, hemoptysis.   Patient describes a near syncopal episode while eating several weeks ago without syncope. This resolved without recurrence of symptoms.   Patient is a 34 y.o. female presenting with chest pain. The history is provided by the patient.  Chest Pain Associated symptoms: fatigue and nausea   Associated symptoms: no abdominal pain, no back pain, no cough, no diaphoresis, no fever, no palpitations, no shortness of breath and not vomiting     Past Medical History   Diagnosis Date  . NSTEMI (non-ST elevated myocardial infarction)    History reviewed. No pertinent past surgical history. History reviewed. No pertinent family history. Social History  Substance Use Topics  . Smoking status: Never Smoker   . Smokeless tobacco: None  . Alcohol Use: No   OB History    No data available     Review of Systems  Constitutional: Positive for fatigue. Negative for fever and diaphoresis.  Eyes: Negative for redness.  Respiratory: Negative for cough and shortness of breath.   Cardiovascular: Positive for chest pain. Negative for palpitations and leg swelling.  Gastrointestinal: Positive for nausea. Negative for vomiting and abdominal pain.  Genitourinary: Negative for dysuria.  Musculoskeletal: Negative for back pain and neck pain.  Skin: Negative for rash.  Neurological: Negative for syncope and light-headedness.  Psychiatric/Behavioral: The patient is not nervous/anxious.       Allergies  Levofloxacin  Home Medications   Prior to Admission medications   Medication Sig Start Date End Date Taking? Authorizing Provider  Multiple Vitamin (MULTIVITAMIN WITH MINERALS) TABS Take 1 tablet by mouth daily.    Historical Provider, MD  promethazine (PHENERGAN) 25 MG tablet Take 1 tablet (25 mg total) by mouth every 6 (six) hours as needed for nausea. 04/01/13   Fayrene Helper, PA-C   BP 128/82 mmHg  Pulse 105  Temp(Src) 98.7 F (37.1 C) (Oral)  Resp 16  Ht 5\' 8"  (1.727 m)  Wt 238 lb (107.956 kg)  BMI 36.20 kg/m2  SpO2 100%  LMP 08/22/2014 Physical Exam  Constitutional: She appears well-developed and well-nourished.  HENT:  Head: Normocephalic and atraumatic.  Mouth/Throat: Oropharynx is clear and moist and mucous membranes are normal. Mucous membranes are not dry.  Eyes:  Conjunctivae are normal.  Neck: Trachea normal and normal range of motion. Neck supple. Normal carotid pulses and no JVD present. No muscular tenderness present. Carotid bruit is not  present. No tracheal deviation present.  Cardiovascular: Normal rate, regular rhythm, S1 normal, S2 normal, normal heart sounds and intact distal pulses.  Exam reveals no decreased pulses.   No murmur heard. Pulmonary/Chest: Effort normal. No respiratory distress. She has no wheezes. She exhibits no tenderness.  Abdominal: Soft. Normal aorta and bowel sounds are normal. There is no tenderness. There is no rebound and no guarding.  Musculoskeletal: Normal range of motion.  Neurological: She is alert.  Skin: Skin is warm and dry. She is not diaphoretic. No cyanosis. No pallor.  Psychiatric: Her mood appears anxious.  Nursing note and vitals reviewed.   ED Course  Procedures (including critical care time) Labs Review Labs Reviewed  URINALYSIS, ROUTINE W REFLEX MICROSCOPIC (NOT AT [facility]) - Abnormal; Notable for the following:    APPearance CLOUDY (*)    Ketones, ur 40 (*)    Leukocytes, UA SMALL (*)    All other components within normal limits  URINE MICROSCOPIC-ADD ON - Abnormal; Notable for the following:    Squamous Epithelial / LPF MANY (*)    Bacteria, UA MANY (*)    All other components within normal limits  BASIC METABOLIC PANEL  CBC  DIFFERENTIAL  TROPONIN I  PREGNANCY, URINE  TROPONIN I    Imaging Review Dg Chest 2 View  10/06/2014   CLINICAL DATA:  Left-sided chest pain  EXAM: CHEST  2 VIEW  COMPARISON:  04/01/2013  FINDINGS: Nodular density projects over the left upper lung zone laterally. This projects over the antral lateral left second rib. Lungs are otherwise clear. No pneumothorax. No pleural effusion.  IMPRESSION: Possible left upper lobe nodule.  CT may be helpful.   Electronically Signed   By: Jolaine Click M.D.   On: 10/06/2014 18:23   I have personally reviewed and evaluated these images and lab results as part of my medical decision-making.   EKG Interpretation   Date/Time:  Thursday October 06 2014 18:03:32 EDT Ventricular Rate:  105 PR Interval:   142 QRS Duration: 84 QT Interval:  336 QTC Calculation: 444 R Axis:   65 Text Interpretation:  Sinus tachycardia Possible Left atrial enlargement  Borderline ECG No significant change since last tracing Confirmed by  Bebe Shaggy  MD, DONALD ([id number]) on 10/06/2014 6:09:11 PM      7:10 PM Patient seen and examined. Work-up initiated. Medications ordered. Discussed with Dr. Bebe Shaggy who will see.   Vital signs reviewed and are as follows: BP 128/82 mmHg  Pulse 105  Temp(Src) 98.7 F (37.1 C) (Oral)  Resp 16  Ht 5\' 8"  (1.727 m)  Wt 238 lb (107.956 kg)  BMI 36.20 kg/m2  SpO2 100%  LMP 08/22/2014  10:25 PM repeat EKG and three-hour troponin both unchanged. Patient informed of results. She has follow-up with PCP and cardiology. She is encouraged to do this.  Patient was counseled to return with severe chest pain, especially if the pain is crushing or pressure-like and spreads to the arms, back, neck, or jaw, or if they have sweating, nausea, or shortness of breath with the pain. They were encouraged to call 911 with these symptoms.   They were also told to return if their chest pain gets worse and does not go away with rest, they have an attack of chest pain lasting longer than usual despite rest and  treatment with the medications their caregiver has prescribed, if they wake from sleep with chest pain or shortness of breath, if they feel dizzy or faint, if they have chest pain not typical of their usual pain, or if they have any other emergent concerns regarding their health.  The patient verbalized understanding and agreed.    MDM   Final diagnoses:  Chest pain, unspecified chest pain type   Patient with chest pain, atypical story, nonischemic EKG, troponins negative 2. HEART=0.  Patient did have an isolated elevated troponin 2010 which required admission to hospital however had no other signs of cardiac issues at that point and diagnosed with noncardiac chest pain. Workup tonight is  reassuring. Doubt PE given her clinical presentation. At this point comfortable with discharge to home with return instructions as above. Patient appears well. She is agreeable to discharge. Encourage follow-up with her PCP and cardiologist for further evaluation.   Renne Crigler, PA-C 10/06/14 2232  Zadie Rhine, MD 10/06/14 [phone]

## 2014-10-06 NOTE — ED Notes (Signed)
No needs expressed at this time

## 2014-10-06 NOTE — ED Notes (Signed)
Patient states that she is extremly nauseated and having left sided chest pain. Reports that she feels weak all over as well

## 2014-10-06 NOTE — ED Provider Notes (Signed)
Patient seen/examined in the Emergency Department in conjunction with Midlevel Provider  Patient reports CP Exam : awake/alert, no distress Plan: 3 hr troponin, if negative she can be discharged Doubt PE/dissection at this time   EKG Interpretation  Date/Time:  Thursday October 06 2014 21:17:14 EDT Ventricular Rate:  99 PR Interval:  148 QRS Duration: 80 QT Interval:  356 QTC Calculation: 456 R Axis:   66 Text Interpretation:  Normal sinus rhythm Normal ECG No significant change since last tracing Confirmed by Bebe Shaggy  MD, Matan Steen ([id number]) on 10/06/2014 9:18:01 PM         Zadie Rhine, MD 10/06/14 2118

## 2014-10-06 NOTE — Discharge Instructions (Signed)
Please read and follow all provided instructions.  Your diagnoses today include:  1. Chest pain, unspecified chest pain type    Tests performed today include:  An EKG of your heart  A chest x-ray  Cardiac enzymes - a blood test for heart muscle damage  Blood counts and electrolytes  Vital signs. See below for your results today.   Medications prescribed:   None  Take any prescribed medications only as directed.  Follow-up instructions: Please follow-up with your primary care provider as soon as you can for further evaluation of your symptoms.   Return instructions:  SEEK IMMEDIATE MEDICAL ATTENTION IF:  You have severe chest pain, especially if the pain is crushing or pressure-like and spreads to the arms, back, neck, or jaw, or if you have sweating, nausea (feeling sick to your stomach), or shortness of breath. THIS IS AN EMERGENCY. Don't wait to see if the pain will go away. Get medical help at once. Call 911 or 0 (operator). DO NOT drive yourself to the hospital.   Your chest pain gets worse and does not go away with rest.   You have an attack of chest pain lasting longer than usual, despite rest and treatment with the medications your caregiver has prescribed.   You wake from sleep with chest pain or shortness of breath.  You feel dizzy or faint.  You have chest pain not typical of your usual pain for which you originally saw your caregiver.   You have any other emergent concerns regarding your health.  Additional Information: Chest pain comes from many different causes. Your caregiver has diagnosed you as having chest pain that is not specific for one problem, but does not require admission.  You are at low risk for an acute heart condition or other serious illness.   Your vital signs today were: BP 112/82 mmHg   Pulse 96   Temp(Src) 98.7 F (37.1 C) (Oral)   Resp 21   Ht  (1.727 m)   Wt 238 lb (107.956 kg)   BMI 36.20 kg/m2   SpO2 98%   LMP 08/22/2014 If  your blood pressure (BP) was elevated above 135/85 this visit, please have this repeated by your doctor within one month. --------------

## 2016-04-08 ENCOUNTER — Encounter (HOSPITAL_BASED_OUTPATIENT_CLINIC_OR_DEPARTMENT_OTHER): Payer: Self-pay

## 2016-04-08 ENCOUNTER — Emergency Department (HOSPITAL_BASED_OUTPATIENT_CLINIC_OR_DEPARTMENT_OTHER)
Admission: EM | Admit: 2016-04-08 | Discharge: 2016-04-08 | Disposition: A | Discharge status: Home / Self Care | Payer: BLUE CROSS/BLUE SHIELD | Attending: Emergency Medicine | Admitting: Emergency Medicine

## 2016-04-08 ENCOUNTER — Emergency Department (HOSPITAL_BASED_OUTPATIENT_CLINIC_OR_DEPARTMENT_OTHER): Payer: BLUE CROSS/BLUE SHIELD

## 2016-04-08 DIAGNOSIS — R0789 Other chest pain: Secondary | ICD-10-CM

## 2016-04-08 DIAGNOSIS — R110 Nausea: Secondary | ICD-10-CM | POA: Diagnosis not present

## 2016-04-08 DIAGNOSIS — R0602 Shortness of breath: Secondary | ICD-10-CM | POA: Diagnosis not present

## 2016-04-08 LAB — CBC
HCT: 34.9 % — ABNORMAL LOW (ref 36.0–46.0)
Hemoglobin: 11.9 g/dL — ABNORMAL LOW (ref 12.0–15.0)
MCH: 29.5 pg (ref 26.0–34.0)
MCHC: 34.1 g/dL (ref 30.0–36.0)
MCV: 86.6 fL (ref 78.0–100.0)
Platelets: 323 10*3/uL (ref 150–400)
RBC: 4.03 MIL/uL (ref 3.87–5.11)
RDW: 12.7 % (ref 11.5–15.5)
WBC: 8.5 10*3/uL (ref 4.0–10.5)

## 2016-04-08 LAB — BASIC METABOLIC PANEL
ANION GAP: 7 (ref 5–15)
BUN: 13 mg/dL (ref 6–20)
CO2: 25 mmol/L (ref 22–32)
Calcium: 8.9 mg/dL (ref 8.9–10.3)
Chloride: 105 mmol/L (ref 101–111)
Creatinine, Ser: 0.77 mg/dL (ref 0.44–1.00)
GFR calc Af Amer: >60 mL/min (ref >60)
GFR calc non Af Amer: >60 mL/min (ref >60)
GLUCOSE: 97 mg/dL (ref 65–99)
POTASSIUM: 3.6 mmol/L (ref 3.5–5.1)
Sodium: 137 mmol/L (ref 135–145)

## 2016-04-08 LAB — TROPONIN I: Troponin I: <0.03 ng/mL (ref <0.03)

## 2016-04-08 LAB — D-DIMER, QUANTITATIVE: D-Dimer, Quant: <0.27 ug/mL-FEU (ref 0.00–0.50)

## 2016-04-08 NOTE — Discharge Instructions (Signed)
Please read and follow all provided instructions.  Your diagnoses today include:  1. Atypical chest pain     Tests performed today include:  An EKG of your heart  A chest x-ray  Cardiac enzymes - a blood test for heart muscle damage  Blood counts and electrolytes  D-dimer test for blood clot - is negative  Vital signs. See below for your results today.   Medications prescribed:   None  Take any prescribed medications only as directed.  Follow-up instructions: Please follow-up with your primary care provider in 1 week for further evaluation of your symptoms.   Return instructions:  SEEK IMMEDIATE MEDICAL ATTENTION IF:  You have severe chest pain, especially if the pain is crushing or pressure-like and spreads to the arms, back, neck, or jaw, or if you have sweating, nausea (feeling sick to your stomach), or shortness of breath. THIS IS AN EMERGENCY. Don't wait to see if the pain will go away. Get medical help at once. Call 911 or 0 (operator). DO NOT drive yourself to the hospital.   Your chest pain gets worse and does not go away with rest.   You have an attack of chest pain lasting longer than usual, despite rest and treatment with the medications your caregiver has prescribed.   You wake from sleep with chest pain or shortness of breath.  You feel dizzy or faint.  You have chest pain not typical of your usual pain for which you originally saw your caregiver.   You have any other emergent concerns regarding your health.  Additional Information: Chest pain comes from many different causes. Your caregiver has diagnosed you as having chest pain that is not specific for one problem, but does not require admission.  You are at low risk for an acute heart condition or other serious illness.   Your vital signs today were: BP 112/74    Pulse 89    Temp 99.1 F (37.3 C) (Oral)    Resp (!) 25    Ht 5\' 8"  (1.727 m)    Wt 108 kg    LMP 04/01/2016    SpO2 100%    BMI 36.19  kg/m  If your blood pressure (BP) was elevated above 135/85 this visit, please have this repeated by your doctor within one month. --------------

## 2016-04-08 NOTE — ED Triage Notes (Addendum)
Pt reports sudden onset left sided chest pain this morning while in class, associated shortness of breath, nausea. Reports HTN, but not medicated. Pt reports a history of NSTEMI.

## 2016-04-08 NOTE — ED Provider Notes (Signed)
[facility] Provider Note   CSN: [id number] Arrival date & time: 04/08/16  1557  By signing my name below, I, Avnee Patel, attest that this documentation has been prepared under the direction and in the presence of  [employer]. Electronically Signed: Clovis Pu, ED Scribe. 04/08/16. 5:03 PM.  History   Chief Complaint Chief Complaint  Patient presents with  . Chest Pain   The history is provided by the patient. No language interpreter was used.   HPI Comments:  Sylvia Wright is a 36 y.o. female, with a PMHx of isolated elevated troponin, who presents to the Emergency Department complaining of acute onset, pressure chest pain which began around 9:30 AM today. She states she was in class when her symptoms began. Pt also reports SOB at rest, nausea and an elevated blood pressure earlier today. No alleviating factors noted. Pt denies leg swelling, calf pain, vomiting, fever. Pt denies hx of blood clots, current hormone therapy (stopped taking birth control last month), any recent surgeries and any recent heavy lifting. Pt is a non-smoker. No other complaints noted.   Past Medical History:  Diagnosis Date  . NSTEMI (non-ST elevated myocardial infarction) [facility])     Patient Active Problem List   Diagnosis Date Noted  . CHEST PAIN 04/29/2008    History reviewed. No pertinent surgical history.  OB History    No data available       Home Medications    Prior to Admission medications   Medication Sig Start Date End Date Taking? Authorizing Provider  Multiple Vitamin (MULTIVITAMIN WITH MINERALS) TABS Take 1 tablet by mouth daily.    Historical Provider, MD  promethazine (PHENERGAN) 25 MG tablet Take 1 tablet (25 mg total) by mouth every 6 (six) hours as needed for nausea. 04/01/13   Fayrene Helper, PA-C    Family History History reviewed. No pertinent family history.  Social History Social History  Substance Use Topics  . Smoking status: Never Smoker  .  Smokeless tobacco: Never Used  . Alcohol use No     Allergies   Levofloxacin   Review of Systems Review of Systems  Constitutional: Negative for diaphoresis and fever.  Eyes: Negative for redness.  Respiratory: Positive for shortness of breath. Negative for cough.   Cardiovascular: Positive for chest pain. Negative for palpitations and leg swelling.  Gastrointestinal: Positive for nausea. Negative for abdominal pain and vomiting.  Genitourinary: Negative for dysuria.  Musculoskeletal: Negative for back pain, myalgias and neck pain.  Skin: Negative for rash.  Neurological: Negative for syncope and light-headedness.  Psychiatric/Behavioral: The patient is not nervous/anxious.    Physical Exam Updated Vital Signs BP 130/88 (BP Location: Right Arm)   Pulse (!) 107   Temp 99.1 F (37.3 C) (Oral)   Resp 18   Ht 5\' 8"  (1.727 m)   Wt 238 lb (108 kg)   LMP 04/01/2016   SpO2 100%   BMI 36.19 kg/m   Physical Exam  Constitutional: She is oriented to person, place, and time. She appears well-developed and well-nourished. No distress.  HENT:  Head: Normocephalic and atraumatic.  Mouth/Throat: Mucous membranes are normal. Mucous membranes are not dry.  Eyes: Conjunctivae are normal.  Neck: Trachea normal and normal range of motion. Neck supple. Normal carotid pulses and no JVD present. No muscular tenderness present. Carotid bruit is not present. No tracheal deviation present.  Cardiovascular: Normal rate, regular rhythm, S1 normal, S2 normal, normal heart sounds and intact distal pulses.  Exam reveals  no decreased pulses.   No murmur heard. Pulmonary/Chest: Effort normal. No respiratory distress. She has no wheezes. She exhibits no tenderness.  Abdominal: Soft. Normal aorta and bowel sounds are normal. She exhibits no distension. There is no tenderness. There is no rebound and no guarding.  Musculoskeletal: Normal range of motion.  Neurological: She is alert and oriented to person,  place, and time.  Skin: Skin is warm and dry. She is not diaphoretic. No cyanosis. No pallor.  Psychiatric: She has a normal mood and affect.  Nursing note and vitals reviewed.    ED Treatments / Results  DIAGNOSTIC STUDIES:  Oxygen Saturation is 100% on RA, normal by my interpretation.    COORDINATION OF CARE:  5:01 PM Will obtain labs including D-dimer. Discussed treatment plan with pt at bedside and pt agreed to plan.  Labs (all labs ordered are listed, but only abnormal results are displayed) Labs Reviewed  CBC - Abnormal; Notable for the following:       Result Value   Hemoglobin 11.9 (*)    HCT 34.9 (*)    All other components within normal limits  BASIC METABOLIC PANEL  TROPONIN I  D-DIMER, QUANTITATIVE (NOT AT [facility])  TROPONIN I    EKG  EKG Interpretation  Date/Time:  Monday April 08 2016 16:30:35 EDT Ventricular Rate:  105 PR Interval:  148 QRS Duration: 86 QT Interval:  330 QTC Calculation: 436 R Axis:   62 Text Interpretation:  Sinus tachycardia Otherwise normal ECG Confirmed by Lincoln Brighamees, Liz [id number]) on 04/08/2016 4:45:43 PM       Radiology Dg Chest 2 View  Result Date: 04/08/2016 CLINICAL DATA:  Left-sided chest pain. EXAM: CHEST  2 VIEW COMPARISON:  03/05/2016 . FINDINGS: Mediastinum and hilar structures normal. Low lung volumes with mild basilar atelectasis. Stable elevation left hemidiaphragm. No pleural effusion or pneumothorax. IMPRESSION: Low lung volumes with mild basilar atelectasis. Stable elevation left hemidiaphragm . Electronically Signed   By: Maisie Fushomas  Register   On: 04/08/2016 17:17    Procedures Procedures (including critical care time)  Medications Ordered in ED Medications - No data to display   Initial Impression / Assessment and Plan / ED Course  I have reviewed the triage vital signs and the nursing notes.  Pertinent labs & imaging results that were available during my care of the patient were reviewed by me and considered in my  medical decision making (see chart for details).     Patient seen and examined.    Vital signs reviewed and are as follows: BP 115/69   Pulse 89   Temp 99.1 F (37.3 C) (Oral)   Resp (!) 25   Ht 5\' 8"  (1.727 m)   Wt 108 kg   LMP 04/01/2016   SpO2 98%   BMI 36.19 kg/m   Second troponin and repeat EKG are unchanged. Patient updated on results. Encouraged PCP follow-up in the next week for recheck.  Patient was counseled to return with severe chest pain, especially if the pain is crushing or pressure-like and spreads to the arms, back, neck, or jaw, or if they have sweating, nausea, or shortness of breath with the pain. They were encouraged to call 911 with these symptoms.   They were also told to return if their chest pain gets worse and does not go away with rest, they have an attack of chest pain lasting longer than usual despite rest and treatment with the medications their caregiver has prescribed, if they wake from sleep with  chest pain or shortness of breath, if they feel dizzy or faint, if they have chest pain not typical of their usual pain, or if they have any other emergent concerns regarding their health.  The patient verbalized understanding and agreed.      Final Clinical Impressions(s) / ED Diagnoses   Final diagnoses:  Atypical chest pain   CP: Patient with chest tightness, SOB. Feel patient is low risk for ACS given history (poor story for ACS/MI), negative troponin(s), normal/unchanged EKG. D-dimer negative - ordered as patient was mildly tachycardic on arrival. Chest x-ray is clear. Low risk score. Do not feel patient requires admission for observation at this time. Return instructions as above. Symptoms are very similar to previous visits. Encouraged PCP follow-up for recheck.    New Prescriptions Discharge Medication List as of 04/08/2016  8:50 PM    I personally performed the services described in this documentation, which was scribed in my presence. The  recorded information has been reviewed and is accurate.     Renne Crigler, PA-C 04/08/16 2121    Tilden Fossa, MD 04/11/16 [phone]

## 2016-04-08 NOTE — ED Notes (Signed)
Pt not in room at this time

## 2020-11-28 ENCOUNTER — Emergency Department (HOSPITAL_BASED_OUTPATIENT_CLINIC_OR_DEPARTMENT_OTHER): Payer: BC Managed Care – PPO | Admitting: Radiology

## 2020-11-28 ENCOUNTER — Other Ambulatory Visit: Payer: Self-pay

## 2020-11-28 ENCOUNTER — Emergency Department (HOSPITAL_BASED_OUTPATIENT_CLINIC_OR_DEPARTMENT_OTHER)
Admission: EM | Admit: 2020-11-28 | Discharge: 2020-11-29 | Disposition: A | Discharge status: Home / Self Care | Payer: BC Managed Care – PPO | Attending: Emergency Medicine | Admitting: Emergency Medicine

## 2020-11-28 ENCOUNTER — Encounter (HOSPITAL_BASED_OUTPATIENT_CLINIC_OR_DEPARTMENT_OTHER): Payer: Self-pay

## 2020-11-28 DIAGNOSIS — I2510 Atherosclerotic heart disease of native coronary artery without angina pectoris: Secondary | ICD-10-CM | POA: Diagnosis not present

## 2020-11-28 DIAGNOSIS — Z79899 Other long term (current) drug therapy: Secondary | ICD-10-CM | POA: Insufficient documentation

## 2020-11-28 DIAGNOSIS — I10 Essential (primary) hypertension: Secondary | ICD-10-CM | POA: Diagnosis not present

## 2020-11-28 DIAGNOSIS — R001 Bradycardia, unspecified: Secondary | ICD-10-CM | POA: Diagnosis not present

## 2020-11-28 DIAGNOSIS — R0789 Other chest pain: Secondary | ICD-10-CM | POA: Diagnosis not present

## 2020-11-28 HISTORY — DX: Atherosclerotic heart disease of native coronary artery without angina pectoris: I25.10

## 2020-11-28 HISTORY — DX: Essential (primary) hypertension: I10

## 2020-11-28 LAB — PREGNANCY, URINE: Preg Test, Ur: NEGATIVE

## 2020-11-28 LAB — BASIC METABOLIC PANEL
Anion gap: 8 (ref 5–15)
BUN: 16 mg/dL (ref 6–20)
CO2: 26 mmol/L (ref 22–32)
Calcium: 9.3 mg/dL (ref 8.9–10.3)
Chloride: 106 mmol/L (ref 98–111)
Creatinine, Ser: 0.71 mg/dL (ref 0.44–1.00)
GFR, Estimated: >60 mL/min (ref >60)
Glucose, Bld: 93 mg/dL (ref 70–99)
Potassium: 3.5 mmol/L (ref 3.5–5.1)
Sodium: 140 mmol/L (ref 135–145)

## 2020-11-28 LAB — CBC
HCT: 38.6 % (ref 36.0–46.0)
Hemoglobin: 12.8 g/dL (ref 12.0–15.0)
MCH: 29.3 pg (ref 26.0–34.0)
MCHC: 33.2 g/dL (ref 30.0–36.0)
MCV: 88.3 fL (ref 80.0–100.0)
Platelets: 300 10*3/uL (ref 150–400)
RBC: 4.37 MIL/uL (ref 3.87–5.11)
RDW: 12.9 % (ref 11.5–15.5)
WBC: 6.2 10*3/uL (ref 4.0–10.5)
nRBC: 0 % (ref 0.0–0.2)

## 2020-11-28 LAB — TROPONIN I (HIGH SENSITIVITY)
Troponin I (High Sensitivity): <2 ng/L (ref <18)
Troponin I (High Sensitivity): <2 ng/L (ref <18)

## 2020-11-28 MED ORDER — SODIUM CHLORIDE 0.9 % IV BOLUS
1000.0000 mL | Freq: Once | INTRAVENOUS | Status: AC
  Administered 2020-11-28: 1000 mL via INTRAVENOUS

## 2020-11-28 NOTE — ED Triage Notes (Signed)
Chest pain since around 16:00 today. Complains with weakness and mild shortness of breath.

## 2020-11-29 NOTE — Discharge Instructions (Signed)
You were seen today for chest pain and low heart rate.  Your cardiac work-up is reassuring.  Follow-up with cardiology.  Reduce propranolol to 3 times daily.  Follow-up closely with your primary physician.

## 2020-11-29 NOTE — ED Provider Notes (Signed)
[facility] EMERGENCY DEPT Provider Note   CSN: [id number] Arrival date & time: 11/28/20  1959     History Chief Complaint  Patient presents with   Chest Pain    Kathryn Mcclusky is a 40 y.o. female.  HPI     This is a 40 year old female with a history of hypertension who presents with chest pain and concerns for bradycardia.  Patient is currently on pantoprazole for migraines.  She was increased from 3 times a day to 4 times a day several months ago and had been tolerating that well.  She started having some anterior chest discomfort while at work around 4 PM.  It has been constant since that time.  She noted her apple watch alerted her to low heart rate.  Her heart rate was in the 40s to 50s.  She states that that is abnormal for her.  No shortness of breath.  No exertional symptoms.  Nothing seems to make the pain better or worse.  No lower extremity swelling or history of blood clots.  Past Medical History:  Diagnosis Date   Coronary artery disease    Hypertension    NSTEMI (non-ST elevated myocardial infarction) [facility])     Patient Active Problem List   Diagnosis Date Noted   CHEST PAIN 04/29/2008    No past surgical history on file.   OB History   No obstetric history on file.     No family history on file.  Social History   Tobacco Use   Smoking status: Never   Smokeless tobacco: Never  Substance Use Topics   Alcohol use: No   Drug use: No    Home Medications Prior to Admission medications   Medication Sig Start Date End Date Taking? Authorizing Provider  lisinopril (ZESTRIL) 20 MG tablet Take 20 mg by mouth daily.   Yes [provider]  ondansetron (ZOFRAN-ODT) 4 MG disintegrating tablet Take 4 mg by mouth every 8 (eight) hours as needed for nausea or vomiting.   Yes [provider]  propranolol (INDERAL) 20 MG tablet Take 20 mg by mouth 4 (four) times daily.   Yes [provider]  SUMAtriptan (IMITREX) 25 MG  tablet Take 25 mg by mouth every 2 (two) hours as needed for migraine. May repeat in 2 hours if headache persists or recurs.   Yes [provider]  Multiple Vitamin (MULTIVITAMIN WITH MINERALS) TABS Take 1 tablet by mouth daily.    [provider]  promethazine (PHENERGAN) 25 MG tablet Take 1 tablet (25 mg total) by mouth every 6 (six) hours as needed for nausea. 04/01/13   Fayrene Helper, PA-C    Allergies    Levofloxacin  Review of Systems   Review of Systems  Constitutional:  Negative for fever.  Respiratory:  Negative for shortness of breath.   Cardiovascular:  Positive for chest pain. Negative for leg swelling.  Gastrointestinal:  Negative for abdominal pain, nausea and vomiting.  Genitourinary:  Negative for dysuria.  All other systems reviewed and are negative.  Physical Exam Updated Vital Signs BP (!) 126/94 (BP Location: Right Arm)   Pulse (!) 59   Temp 97.9 F (36.6 C)   Resp (!) 21   Ht 1.702 m (5\' 7" )   Wt 111.1 kg   SpO2 100%   BMI 38.37 kg/m   Physical Exam Vitals and nursing note reviewed.  Constitutional:      Appearance: She is well-developed. She is obese.  HENT:  Head: Normocephalic and atraumatic.  Eyes:     Pupils: Pupils are equal, round, and reactive to light.  Cardiovascular:     Rate and Rhythm: Regular rhythm. Bradycardia present.     Heart sounds: Normal heart sounds.  Pulmonary:     Effort: Pulmonary effort is normal. No respiratory distress.     Breath sounds: No wheezing.  Chest:     Chest wall: No tenderness.  Abdominal:     General: Bowel sounds are normal.     Palpations: Abdomen is soft.  Musculoskeletal:     Cervical back: Neck supple.     Right lower leg: No tenderness. No edema.     Left lower leg: No tenderness. No edema.  Skin:    General: Skin is warm and dry.  Neurological:     Mental Status: She is alert and oriented to person, place, and time.  Psychiatric:        Mood and Affect: Mood normal.     ED Results / Procedures / Treatments   Labs (all labs ordered are listed, but only abnormal results are displayed) Labs Reviewed  BASIC METABOLIC PANEL  CBC  PREGNANCY, URINE  TROPONIN I (HIGH SENSITIVITY)  TROPONIN I (HIGH SENSITIVITY)    EKG EKG Interpretation  Date/Time:  Tuesday November 28 2020 20:11:36 EST Ventricular Rate:  56 PR Interval:  158 QRS Duration: 70 QT Interval:  414 QTC Calculation: 399 R Axis:   37 Text Interpretation: Sinus bradycardia Otherwise normal ECG Confirmed by Ross Marcus ([id number]) on 11/28/2020 10:59:35 PM  Radiology DG Chest 2 View  Result Date: 11/28/2020 CLINICAL DATA:  Chest pain since 1600 hours, weakness, mild shortness of breath, pain radiates down LEFT arm EXAM: CHEST - 2 VIEW COMPARISON:  04/08/2016 FINDINGS: Normal heart size, mediastinal contours, and pulmonary vascularity. Lungs clear. No pleural effusion or pneumothorax. Bones unremarkable. IMPRESSION: Normal exam. Electronically Signed   By: Ulyses Southward M.D.   On: 11/28/2020 20:36    Procedures Procedures   Medications Ordered in ED Medications  sodium chloride 0.9 % bolus 1,000 mL (1,000 mLs Intravenous New Bag/Given 11/28/20 2322)    ED Course  I have reviewed the triage vital signs and the nursing notes.  Pertinent labs & imaging results that were available during my care of the patient were reviewed by me and considered in my medical decision making (see chart for details).    MDM Rules/Calculators/A&P                           Patient presents with chest pain.  Constant since 4 PM.  She is nontoxic and vital signs are reassuring.  EKG shows no evidence of acute ischemia or other arrhythmia.  She is slightly bradycardic in the 50s.  Her only risk factor for ACS is hypertension.  She previously did have a stress test approximately 10 years ago that was reassuring in addition to other cardiac work-up.  Troponin x2 negative.  Doubt ACS.  She is PERC negative.  DOubt  PE.  Chest x-ray without evidence of pneumothorax or pneumonia.  Wonder whether her symptoms may be related to lower heart rate.  While she did not have any recent changes in meds, she did have an increase from 3-4 times daily of her propranolol several months ago.  We will reduce her back to surgery times per day.  Referral to cardiology and her primary physician.  After history, exam, and medical workup I  feel the patient has been appropriately medically screened and is safe for discharge home. Pertinent diagnoses were discussed with the patient. Patient was given return precautions.  Final Clinical Impression(s) / ED Diagnoses Final diagnoses:  Atypical chest pain  Bradycardia    Rx / DC Orders ED Discharge Orders     None        Shawnay Bramel, Mayer Masker, MD 11/29/20 [phone]

## 2022-05-19 IMAGING — DX DG CHEST 2V
2 series · 2 of 2 positions shown · non-contrast
Comparison: 04/08/2016

CLINICAL DATA: Chest pain since 9400 hours, weakness, mild
shortness of breath, pain radiates down LEFT arm

EXAM:
CHEST - 2 VIEW

[chest pa]
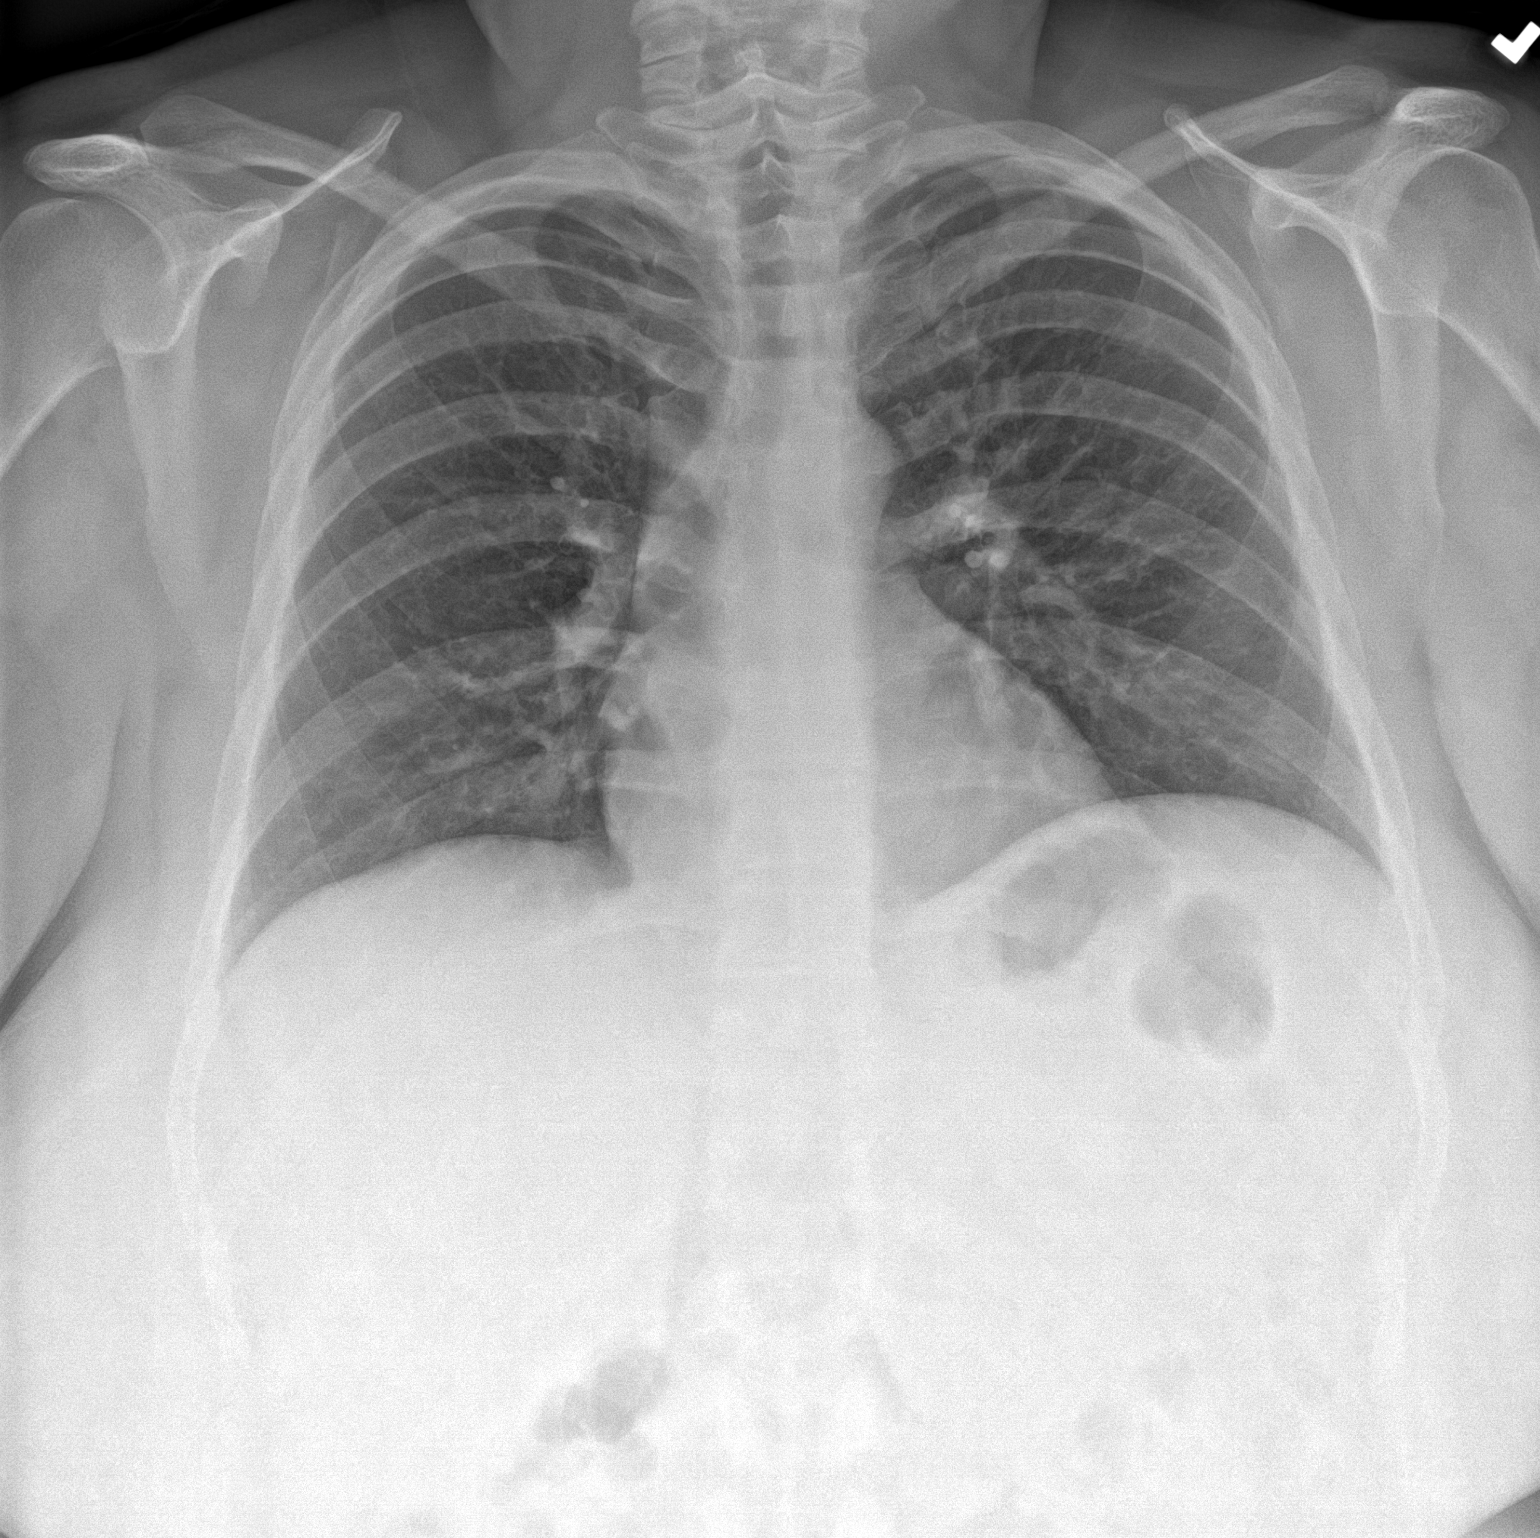

[chest lat]
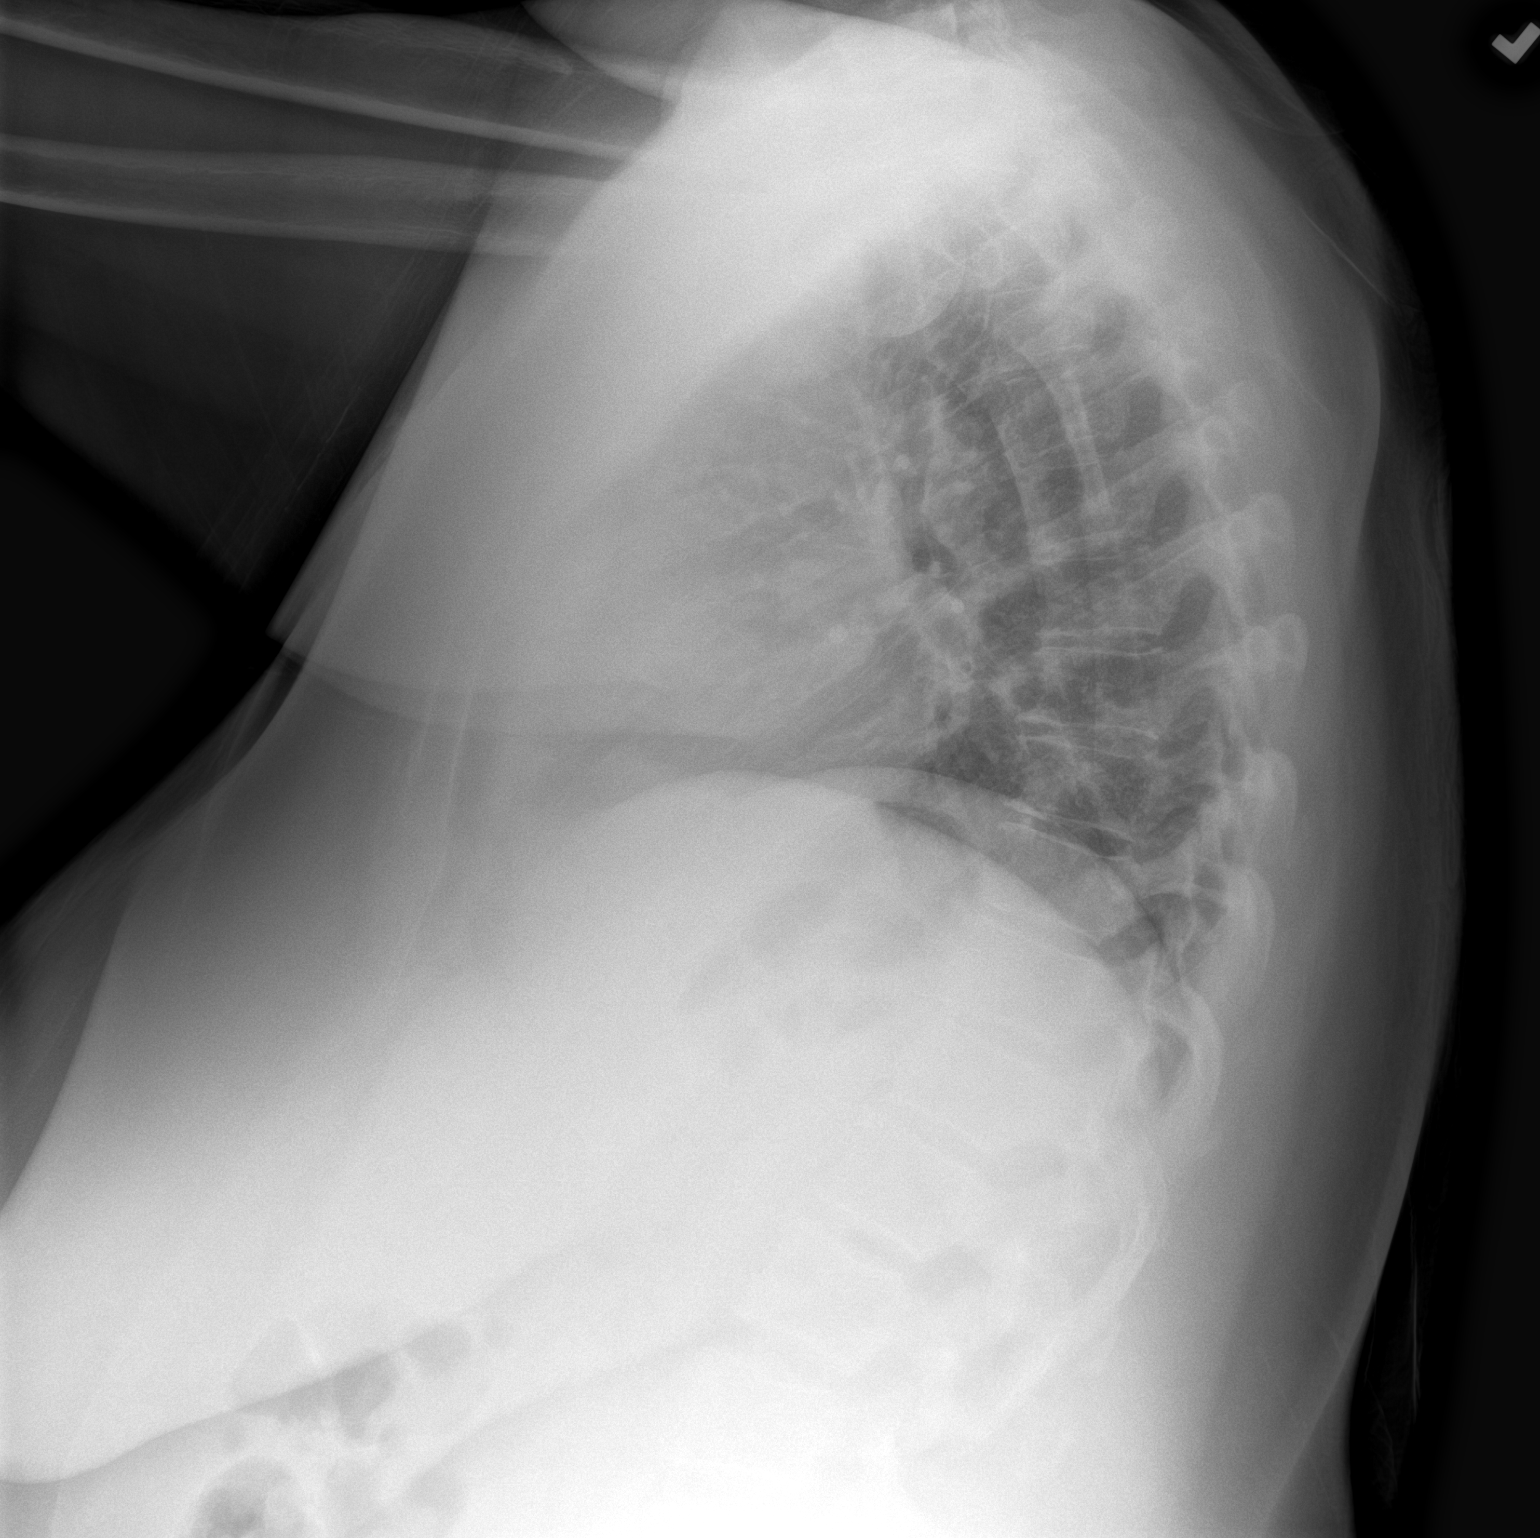

[2 of 2 positions shown; findings below may reference images not displayed]

FINDINGS: Normal heart size, mediastinal contours, and pulmonary vascularity.

Lungs clear.

No pleural effusion or pneumothorax.

Bones unremarkable.
IMPRESSION: Normal exam.

## 2023-08-29 ENCOUNTER — Other Ambulatory Visit: Payer: Self-pay | Admitting: Medical Genetics

## 2023-09-23 ENCOUNTER — Other Ambulatory Visit (HOSPITAL_COMMUNITY)
Admission: RE | Admit: 2023-09-23 | Discharge: 2023-09-23 | Disposition: A | Discharge status: Home / Self Care | Payer: Self-pay | Source: Ambulatory Visit | Attending: Medical Genetics | Admitting: Medical Genetics

## 2023-09-29 LAB — GENECONNECT MOLECULAR SCREEN: Genetic Analysis Overall Interpretation: NEGATIVE
